# Patient Record
Sex: Male | Born: 1945 | Race: Black or African American | Hispanic: No | Smoking: Former smoker
Health system: Southern US, Community
[De-identification: ages and names within clinical notes are randomized; demographics above are authoritative.]

## PROBLEM LIST (undated history)

## (undated) DIAGNOSIS — M1990 Unspecified osteoarthritis, unspecified site: Secondary | ICD-10-CM

## (undated) DIAGNOSIS — I10 Essential (primary) hypertension: Secondary | ICD-10-CM

## (undated) DIAGNOSIS — K219 Gastro-esophageal reflux disease without esophagitis: Secondary | ICD-10-CM

## (undated) DIAGNOSIS — D563 Thalassemia minor: Secondary | ICD-10-CM

## (undated) DIAGNOSIS — N201 Calculus of ureter: Secondary | ICD-10-CM

## (undated) DIAGNOSIS — N138 Other obstructive and reflux uropathy: Secondary | ICD-10-CM

## (undated) DIAGNOSIS — R768 Other specified abnormal immunological findings in serum: Secondary | ICD-10-CM

## (undated) DIAGNOSIS — N401 Enlarged prostate with lower urinary tract symptoms: Secondary | ICD-10-CM

## (undated) DIAGNOSIS — I428 Other cardiomyopathies: Secondary | ICD-10-CM

## (undated) DIAGNOSIS — J302 Other seasonal allergic rhinitis: Secondary | ICD-10-CM

## (undated) DIAGNOSIS — E785 Hyperlipidemia, unspecified: Secondary | ICD-10-CM

## (undated) DIAGNOSIS — Z87440 Personal history of urinary (tract) infections: Secondary | ICD-10-CM

## (undated) HISTORY — DX: Other cardiomyopathies: I42.8

## (undated) HISTORY — PX: OTHER SURGICAL HISTORY: SHX169

## (undated) HISTORY — DX: Gastro-esophageal reflux disease without esophagitis: K21.9

## (undated) HISTORY — DX: Other seasonal allergic rhinitis: J30.2

## (undated) HISTORY — DX: Hyperlipidemia, unspecified: E78.5

---

## 1996-11-23 HISTORY — PX: ROTATOR CUFF REPAIR: SHX139

## 2003-02-22 ENCOUNTER — Ambulatory Visit (HOSPITAL_COMMUNITY): Admission: RE | Admit: 2003-02-22 | Discharge: 2003-02-22 | Payer: Self-pay | Admitting: Family Medicine

## 2003-02-22 ENCOUNTER — Encounter: Payer: Self-pay | Admitting: Family Medicine

## 2004-05-15 ENCOUNTER — Ambulatory Visit (HOSPITAL_COMMUNITY): Admission: RE | Admit: 2004-05-15 | Discharge: 2004-05-15 | Payer: Self-pay | Admitting: Family Medicine

## 2004-06-30 ENCOUNTER — Ambulatory Visit (HOSPITAL_COMMUNITY): Admission: RE | Admit: 2004-06-30 | Discharge: 2004-06-30 | Payer: Self-pay | Admitting: Internal Medicine

## 2006-03-24 ENCOUNTER — Ambulatory Visit (HOSPITAL_COMMUNITY): Admission: RE | Admit: 2006-03-24 | Discharge: 2006-03-24 | Payer: Self-pay | Admitting: Family Medicine

## 2006-12-30 ENCOUNTER — Ambulatory Visit (HOSPITAL_COMMUNITY): Admission: RE | Admit: 2006-12-30 | Discharge: 2006-12-30 | Payer: Self-pay | Admitting: Family Medicine

## 2010-11-23 HISTORY — PX: NASAL SINUS SURGERY: SHX719

## 2013-06-19 ENCOUNTER — Telehealth: Payer: Self-pay

## 2013-06-19 NOTE — Telephone Encounter (Signed)
Called pt. His last colonoscopy was 06/30/2004. He is not having any problems and no family hx of colon cancer. ( Requested the path from Trish in [facility]) . Looks like he is not due until 06/2014 and we will make sure he is on recall.

## 2013-06-19 NOTE — Telephone Encounter (Signed)
Letter, procedure and path report faxed to Dr. Regino Schultze.

## 2013-06-19 NOTE — Telephone Encounter (Signed)
Reminder in epic °

## 2013-06-19 NOTE — Telephone Encounter (Signed)
Pt called this morning to be triaged. He had received a letter from DS back in June. Pt transferred to DS VM and she will call patient back later. [phone]

## 2013-07-18 ENCOUNTER — Encounter: Payer: Self-pay | Admitting: Internal Medicine

## 2013-07-19 ENCOUNTER — Telehealth: Payer: Self-pay | Admitting: Gastroenterology

## 2013-07-19 ENCOUNTER — Ambulatory Visit: Payer: Self-pay | Admitting: Gastroenterology

## 2013-07-19 NOTE — Telephone Encounter (Signed)
Please offer reschedule OV.

## 2013-07-19 NOTE — Telephone Encounter (Signed)
Pt was a no show

## 2013-07-20 ENCOUNTER — Encounter: Payer: Self-pay | Admitting: Gastroenterology

## 2013-07-20 NOTE — Telephone Encounter (Signed)
Mailed letter for patient to call our office to [facility] his OV

## 2013-08-21 ENCOUNTER — Other Ambulatory Visit: Payer: Self-pay | Admitting: Internal Medicine

## 2013-08-21 ENCOUNTER — Ambulatory Visit (INDEPENDENT_AMBULATORY_CARE_PROVIDER_SITE_OTHER): Payer: Medicare Other | Admitting: Gastroenterology

## 2013-08-21 ENCOUNTER — Encounter: Payer: Self-pay | Admitting: Gastroenterology

## 2013-08-21 VITALS — BP 140/81 | HR 63 | Temp 98.3°F | Ht 70.0 in | Wt 190.0 lb

## 2013-08-21 DIAGNOSIS — K625 Hemorrhage of anus and rectum: Secondary | ICD-10-CM

## 2013-08-21 MED ORDER — PEG 3350-KCL-NA BICARB-NACL 420 G PO SOLR: 4000.0000 mL | ORAL | Status: DC

## 2013-08-21 NOTE — Patient Instructions (Signed)
1. We have scheduled you for a colonoscopy with Dr. Rourk. Please see separate instructions. 

## 2013-08-21 NOTE — Progress Notes (Signed)
Primary Care Physician:  Kirk Ruths, MD  Primary Gastroenterologist:  Roetta Sessions, MD   Chief Complaint  Patient presents with  . Rectal Bleeding    stopped now    HPI:  James Mccall is a 67 y.o. male here for further evaluation of rectal bleeding. This occurred last month. Lasted for about a week. No constipation, diarrhea, abdominal pain, n/v, heartburn, dysphagia. On omeprazole for ?LPR per ENT. Last colonoscopy in 2005. He had internal hemorrhoids, coagulated polyp removed from the rectum which was benign without adenomatous changes. Also had left-sided and transverse diverticula. No family history of colon cancer.  Current Outpatient Prescriptions  Medication Sig Dispense Refill  . fluticasone (FLONASE) 50 MCG/ACT nasal spray Place 1 spray into the nose daily.       Marland Kitchen ipratropium (ATROVENT) 0.06 % nasal spray Place 1 spray into the nose 3 (three) times daily.       . meloxicam (MOBIC) 7.5 MG tablet Take 7.5 mg by mouth daily.       Marland Kitchen omeprazole (PRILOSEC) 20 MG capsule Take 20 mg by mouth daily.       Marland Kitchen RAPAFLO 8 MG CAPS capsule Take 8 mg by mouth daily with breakfast.       . simvastatin (ZOCOR) 20 MG tablet 20 mg daily.       . tamsulosin (FLOMAX) 0.4 MG CAPS capsule Take 0.4 mg by mouth daily.       Marland Kitchen zolpidem (AMBIEN) 5 MG tablet Take 5 mg by mouth at bedtime as needed.        No current facility-administered medications for this visit.    Allergies as of 08/21/2013  . (No Known Allergies)    Past Medical History  Diagnosis Date  . Hyperlipidemia   . GERD (gastroesophageal reflux disease)   . BPH (benign prostatic hyperplasia)   . Seasonal allergies     Past Surgical History  Procedure Laterality Date  . Colonoscopy  06/30/04    [id number] hemorrhoids/pedunculated polyp in the rectum/left-sided and transverse diverticula  . Shoulder sugery      both  . Nasal sinus surgery      Family History  Problem Relation Age of Onset  . Colon cancer Neg Hx      History   Social History  . Marital Status: Married    Spouse Name: N/A    Number of Children: N/A  . Years of Education: N/A   Occupational History  . Not on file.   Social History Main Topics  . Smoking status: Never Smoker   . Smokeless tobacco: Not on file  . Alcohol Use: Yes     Comment: 1-2 weekly  . Drug Use: No  . Sexual Activity: Not on file   Other Topics Concern  . Not on file   Social History Narrative  . No narrative on file      ROS:  General: Negative for anorexia, weight loss, fever, chills, fatigue, weakness. Eyes: Negative for vision changes.  ENT: Negative for hoarseness, difficulty swallowing , nasal congestion. CV: Negative for chest pain, angina, palpitations, dyspnea on exertion, peripheral edema.  Respiratory: Negative for dyspnea at rest, dyspnea on exertion, cough, sputum, wheezing.  GI: See history of present illness. GU:  Negative for dysuria, hematuria, urinary incontinence, urinary frequency, nocturnal urination.  MS: Negative for joint pain, low back pain.  Derm: Negative for rash or itching.  Neuro: Negative for weakness, abnormal sensation, seizure, frequent headaches, memory loss, confusion.  Psych: Negative for  anxiety, depression, suicidal ideation, hallucinations.  Endo: Negative for unusual weight change.  Heme: Negative for bruising or bleeding. Allergy: Negative for rash or hives.    Physical Examination:  BP 140/81  Pulse 63  Temp(Src) 98.3 F (36.8 C) (Oral)  Ht 5\' 10"  (1.778 m)  Wt 190 lb (86.183 kg)  BMI 27.26 kg/m2   General: Well-nourished, well-developed in no acute distress.  Head: Normocephalic, atraumatic.   Eyes: Conjunctiva pink, no icterus. Mouth: Oropharyngeal mucosa moist and pink , no lesions erythema or exudate. Neck: Supple without thyromegaly, masses, or lymphadenopathy.  Lungs: Clear to auscultation bilaterally.  Heart: Regular rate and rhythm, no murmurs rubs or gallops.  Abdomen: Bowel  sounds are normal, nontender, nondistended, no hepatosplenomegaly or masses, no abdominal bruits or    hernia , no rebound or guarding.   Rectal: Deferred Extremities: No lower extremity edema. No clubbing or deformities.  Neuro: Alert and oriented x 4 , grossly normal neurologically.  Skin: Warm and dry, no rash or jaundice.   Psych: Alert and cooperative, normal mood and affect.

## 2013-08-21 NOTE — Assessment & Plan Note (Signed)
67 year old gentleman with recent rectal bleeding which is now resolved. Likely due to benign anorectal source such as hemorrhoids. Last colonoscopy 9 years ago, benign polyp removed from the rectum, internal hemorrhoids, diverticulosis. Recommend diagnostic colonoscopy at this time.  I have discussed the risks, alternatives, benefits with regards to but not limited to the risk of reaction to medication, bleeding, infection, perforation and the patient is agreeable to proceed. Written consent to be obtained.

## 2013-08-21 NOTE — Progress Notes (Signed)
CC'd to PCP 

## 2013-08-28 ENCOUNTER — Ambulatory Visit (HOSPITAL_COMMUNITY)
Admission: RE | Admit: 2013-08-28 | Discharge: 2013-08-28 | Disposition: A | Discharge status: Home / Self Care | Payer: Medicare Other | Source: Ambulatory Visit | Attending: Internal Medicine | Admitting: Internal Medicine

## 2013-08-28 ENCOUNTER — Encounter (HOSPITAL_COMMUNITY): Payer: Self-pay | Admitting: *Deleted

## 2013-08-28 ENCOUNTER — Encounter (HOSPITAL_COMMUNITY)
Admission: RE | Disposition: A | Discharge status: Home / Self Care | Payer: Self-pay | Source: Ambulatory Visit | Attending: Internal Medicine

## 2013-08-28 DIAGNOSIS — K921 Melena: Secondary | ICD-10-CM | POA: Insufficient documentation

## 2013-08-28 DIAGNOSIS — K625 Hemorrhage of anus and rectum: Secondary | ICD-10-CM

## 2013-08-28 DIAGNOSIS — D126 Benign neoplasm of colon, unspecified: Secondary | ICD-10-CM

## 2013-08-28 DIAGNOSIS — K5730 Diverticulosis of large intestine without perforation or abscess without bleeding: Secondary | ICD-10-CM | POA: Insufficient documentation

## 2013-08-28 DIAGNOSIS — K648 Other hemorrhoids: Secondary | ICD-10-CM

## 2013-08-28 HISTORY — PX: COLONOSCOPY: SHX5424

## 2013-08-28 SURGERY — COLONOSCOPY
Anesthesia: Moderate Sedation

## 2013-08-28 MED ORDER — ONDANSETRON HCL 4 MG/2ML IJ SOLN
INTRAMUSCULAR | Status: AC
  Filled 2013-08-28: qty 2

## 2013-08-28 MED ORDER — STERILE WATER FOR IRRIGATION IR SOLN
Status: DC | PRN
  Administered 2013-08-28: 10:00:00

## 2013-08-28 MED ORDER — MEPERIDINE HCL 100 MG/ML IJ SOLN
INTRAMUSCULAR | Status: AC
  Filled 2013-08-28: qty 2

## 2013-08-28 MED ORDER — SODIUM CHLORIDE 0.9 % IV SOLN
INTRAVENOUS | Status: DC
  Administered 2013-08-28: 09:00:00 via INTRAVENOUS

## 2013-08-28 MED ORDER — ONDANSETRON HCL 4 MG/2ML IJ SOLN
INTRAMUSCULAR | Status: DC | PRN
  Administered 2013-08-28: 4 mg via INTRAVENOUS

## 2013-08-28 MED ORDER — MIDAZOLAM HCL 5 MG/5ML IJ SOLN
INTRAMUSCULAR | Status: DC | PRN
  Administered 2013-08-28: 1 mg via INTRAVENOUS
  Administered 2013-08-28: 2 mg via INTRAVENOUS

## 2013-08-28 MED ORDER — MIDAZOLAM HCL 5 MG/5ML IJ SOLN
INTRAMUSCULAR | Status: AC
  Filled 2013-08-28: qty 10

## 2013-08-28 MED ORDER — MEPERIDINE HCL 100 MG/ML IJ SOLN
INTRAMUSCULAR | Status: DC | PRN
  Administered 2013-08-28: 50 mg via INTRAVENOUS

## 2013-08-28 NOTE — Op Note (Signed)
[facility] [address] [place], [address]   COLONOSCOPY PROCEDURE REPORT  PATIENT: James Mccall, James Mccall  MR#:         [id number] BIRTHDATE: January 06, 1946 , 67  yrs. old GENDER: Male ENDOSCOPIST: R.  Roetta Sessions, MD FACP FACG REFERRED BY:  Karleen Hampshire, M.D. PROCEDURE DATE:  08/28/2013 PROCEDURE:     Colonoscopy with snare polypectomy  INDICATIONS: hematochezia  INFORMED CONSENT:  The risks, benefits, alternatives and imponderables including but not limited to bleeding, perforation as well as the possibility of a missed lesion have been reviewed.  The potential for biopsy, lesion removal, etc. have also been discussed.  Questions have been answered.  All parties agreeable. Please see the history and physical in the medical record for more information.  MEDICATIONS: Versed 3 mg IV and Demerol 50 mg IV in divided doses. Zofran 4 mg IV  DESCRIPTION OF PROCEDURE:  After a digital rectal exam was performed, the EC-[id number] ([id number])  colonoscope was advanced from the anus through the rectum and colon to the area of the cecum, ileocecal valve and appendiceal orifice.  The cecum was deeply intubated.  These structures were well-seen and photographed for the record.  From the level of the cecum and ileocecal valve, the scope was slowly and cautiously withdrawn.  The mucosal surfaces were carefully surveyed utilizing scope tip deflection to facilitate fold flattening as needed.  The scope was pulled down into the rectum where a thorough examination including retroflexion was performed.    FINDINGS:  Adequate preparation. Internal hemorrhoids. Otherwise, normal rectum. Scattered left-sided and transverse diverticula; (1) 5 mm polyp in the mid sigmoid segment; otherwise, the remainder of the colonic mucosa appeared normal.  THERAPEUTIC / DIAGNOSTIC MANEUVERS PERFORMED:  The above-mentioned polyp was cold snared/removed  COMPLICATIONS: none  CECAL WITHDRAWAL  TIME:  13 minutes  IMPRESSION:  pancolonic diverticulosis; colonic polyp-removed as described above. Internal hemorrhoids-likely source of hematochezia  RECOMMENDATIONS: get followup on pathology. Begin Benefiber 2 teaspoons twice daily. If rectal bleeding is recurrent, would consider hemorrhoid banding.   _______________________________ eSigned:  R. Roetta Sessions, MD FACP [facility] 08/28/2013 10:54 AM   CC:

## 2013-08-28 NOTE — H&P (View-Only) (Signed)
Primary Care Physician:  Kirk Ruths, MD  Primary Gastroenterologist:  Roetta Sessions, MD   Chief Complaint  Patient presents with  . Rectal Bleeding    stopped now    HPI:  James Mccall is a 67 y.o. male here for further evaluation of rectal bleeding. This occurred last month. Lasted for about a week. No constipation, diarrhea, abdominal pain, n/v, heartburn, dysphagia. On omeprazole for ?LPR per ENT. Last colonoscopy in 2005. He had internal hemorrhoids, coagulated polyp removed from the rectum which was benign without adenomatous changes. Also had left-sided and transverse diverticula. No family history of colon cancer.  Current Outpatient Prescriptions  Medication Sig Dispense Refill  . fluticasone (FLONASE) 50 MCG/ACT nasal spray Place 1 spray into the nose daily.       Marland Kitchen ipratropium (ATROVENT) 0.06 % nasal spray Place 1 spray into the nose 3 (three) times daily.       . meloxicam (MOBIC) 7.5 MG tablet Take 7.5 mg by mouth daily.       Marland Kitchen omeprazole (PRILOSEC) 20 MG capsule Take 20 mg by mouth daily.       Marland Kitchen RAPAFLO 8 MG CAPS capsule Take 8 mg by mouth daily with breakfast.       . simvastatin (ZOCOR) 20 MG tablet 20 mg daily.       . tamsulosin (FLOMAX) 0.4 MG CAPS capsule Take 0.4 mg by mouth daily.       Marland Kitchen zolpidem (AMBIEN) 5 MG tablet Take 5 mg by mouth at bedtime as needed.        No current facility-administered medications for this visit.    Allergies as of 08/21/2013  . (No Known Allergies)    Past Medical History  Diagnosis Date  . Hyperlipidemia   . GERD (gastroesophageal reflux disease)   . BPH (benign prostatic hyperplasia)   . Seasonal allergies     Past Surgical History  Procedure Laterality Date  . Colonoscopy  06/30/04    [id number] hemorrhoids/pedunculated polyp in the rectum/left-sided and transverse diverticula  . Shoulder sugery      both  . Nasal sinus surgery      Family History  Problem Relation Age of Onset  . Colon cancer Neg Hx      History   Social History  . Marital Status: Married    Spouse Name: N/A    Number of Children: N/A  . Years of Education: N/A   Occupational History  . Not on file.   Social History Main Topics  . Smoking status: Never Smoker   . Smokeless tobacco: Not on file  . Alcohol Use: Yes     Comment: 1-2 weekly  . Drug Use: No  . Sexual Activity: Not on file   Other Topics Concern  . Not on file   Social History Narrative  . No narrative on file      ROS:  General: Negative for anorexia, weight loss, fever, chills, fatigue, weakness. Eyes: Negative for vision changes.  ENT: Negative for hoarseness, difficulty swallowing , nasal congestion. CV: Negative for chest pain, angina, palpitations, dyspnea on exertion, peripheral edema.  Respiratory: Negative for dyspnea at rest, dyspnea on exertion, cough, sputum, wheezing.  GI: See history of present illness. GU:  Negative for dysuria, hematuria, urinary incontinence, urinary frequency, nocturnal urination.  MS: Negative for joint pain, low back pain.  Derm: Negative for rash or itching.  Neuro: Negative for weakness, abnormal sensation, seizure, frequent headaches, memory loss, confusion.  Psych: Negative for  anxiety, depression, suicidal ideation, hallucinations.  Endo: Negative for unusual weight change.  Heme: Negative for bruising or bleeding. Allergy: Negative for rash or hives.    Physical Examination:  BP 140/81  Pulse 63  Temp(Src) 98.3 F (36.8 C) (Oral)  Ht 5\' 10"  (1.778 m)  Wt 190 lb (86.183 kg)  BMI 27.26 kg/m2   General: Well-nourished, well-developed in no acute distress.  Head: Normocephalic, atraumatic.   Eyes: Conjunctiva pink, no icterus. Mouth: Oropharyngeal mucosa moist and pink , no lesions erythema or exudate. Neck: Supple without thyromegaly, masses, or lymphadenopathy.  Lungs: Clear to auscultation bilaterally.  Heart: Regular rate and rhythm, no murmurs rubs or gallops.  Abdomen: Bowel  sounds are normal, nontender, nondistended, no hepatosplenomegaly or masses, no abdominal bruits or    hernia , no rebound or guarding.   Rectal: Deferred Extremities: No lower extremity edema. No clubbing or deformities.  Neuro: Alert and oriented x 4 , grossly normal neurologically.  Skin: Warm and dry, no rash or jaundice.   Psych: Alert and cooperative, normal mood and affect.

## 2013-08-28 NOTE — Interval H&P Note (Signed)
History and Physical Interval Note:  08/28/2013 10:14 AM  James Mccall  has presented today for surgery, with the diagnosis of RECTAL BLEEDING  The various methods of treatment have been discussed with the patient and family. After consideration of risks, benefits and other options for treatment, the patient has consented to  Procedure(s) with comments: COLONOSCOPY (N/A) - 9:45 as a surgical intervention .  The patient's history has been reviewed, patient examined, no change in status, stable for surgery.  I have reviewed the patient's chart and labs.  Questions were answered to the patient's satisfaction.     No change. Colonoscopy per plan. The risks, benefits, limitations, alternatives and imponderables have been reviewed with the patient. Questions have been answered. All parties are agreeable.

## 2013-08-30 ENCOUNTER — Encounter (HOSPITAL_COMMUNITY): Payer: Self-pay | Admitting: Internal Medicine

## 2013-08-31 ENCOUNTER — Encounter: Payer: Self-pay | Admitting: Internal Medicine

## 2013-09-05 ENCOUNTER — Encounter: Payer: Self-pay | Admitting: Internal Medicine

## 2014-12-06 DIAGNOSIS — E663 Overweight: Secondary | ICD-10-CM | POA: Diagnosis not present

## 2014-12-06 DIAGNOSIS — Z6829 Body mass index (BMI) 29.0-29.9, adult: Secondary | ICD-10-CM | POA: Diagnosis not present

## 2014-12-06 DIAGNOSIS — J180 Bronchopneumonia, unspecified organism: Secondary | ICD-10-CM | POA: Diagnosis not present

## 2015-02-04 DIAGNOSIS — Z6829 Body mass index (BMI) 29.0-29.9, adult: Secondary | ICD-10-CM | POA: Diagnosis not present

## 2015-02-04 DIAGNOSIS — I10 Essential (primary) hypertension: Secondary | ICD-10-CM | POA: Diagnosis not present

## 2015-03-21 DIAGNOSIS — J209 Acute bronchitis, unspecified: Secondary | ICD-10-CM | POA: Diagnosis not present

## 2015-05-21 ENCOUNTER — Other Ambulatory Visit (HOSPITAL_COMMUNITY): Payer: Self-pay | Admitting: Family Medicine

## 2015-05-21 ENCOUNTER — Ambulatory Visit (HOSPITAL_COMMUNITY)
Admission: RE | Admit: 2015-05-21 | Discharge: 2015-05-21 | Disposition: A | Discharge status: Home / Self Care | Payer: Medicare Other | Source: Ambulatory Visit | Attending: Family Medicine | Admitting: Family Medicine

## 2015-05-21 DIAGNOSIS — Z1389 Encounter for screening for other disorder: Secondary | ICD-10-CM | POA: Diagnosis not present

## 2015-05-21 DIAGNOSIS — N400 Enlarged prostate without lower urinary tract symptoms: Secondary | ICD-10-CM | POA: Diagnosis not present

## 2015-05-21 DIAGNOSIS — M542 Cervicalgia: Secondary | ICD-10-CM | POA: Insufficient documentation

## 2015-05-21 DIAGNOSIS — M25512 Pain in left shoulder: Secondary | ICD-10-CM | POA: Insufficient documentation

## 2015-05-21 DIAGNOSIS — Z0000 Encounter for general adult medical examination without abnormal findings: Secondary | ICD-10-CM | POA: Diagnosis not present

## 2015-05-21 DIAGNOSIS — Z23 Encounter for immunization: Secondary | ICD-10-CM | POA: Diagnosis not present

## 2015-05-21 DIAGNOSIS — M5412 Radiculopathy, cervical region: Secondary | ICD-10-CM

## 2015-05-21 DIAGNOSIS — M47812 Spondylosis without myelopathy or radiculopathy, cervical region: Secondary | ICD-10-CM | POA: Diagnosis not present

## 2015-05-21 DIAGNOSIS — M5032 Other cervical disc degeneration, mid-cervical region: Secondary | ICD-10-CM | POA: Diagnosis not present

## 2015-05-21 DIAGNOSIS — M479 Spondylosis, unspecified: Secondary | ICD-10-CM | POA: Diagnosis not present

## 2015-05-21 DIAGNOSIS — F17200 Nicotine dependence, unspecified, uncomplicated: Secondary | ICD-10-CM

## 2015-05-22 DIAGNOSIS — D582 Other hemoglobinopathies: Secondary | ICD-10-CM | POA: Diagnosis not present

## 2015-05-24 ENCOUNTER — Other Ambulatory Visit (HOSPITAL_COMMUNITY): Payer: Self-pay | Admitting: Family Medicine

## 2015-05-24 DIAGNOSIS — M5090 Cervical disc disorder, unspecified, unspecified cervical region: Secondary | ICD-10-CM

## 2015-05-29 DIAGNOSIS — Z1211 Encounter for screening for malignant neoplasm of colon: Secondary | ICD-10-CM | POA: Diagnosis not present

## 2015-05-30 ENCOUNTER — Ambulatory Visit (HOSPITAL_COMMUNITY)
Admission: RE | Admit: 2015-05-30 | Discharge: 2015-05-30 | Disposition: A | Discharge status: Home / Self Care | Payer: Medicare Other | Source: Ambulatory Visit | Attending: Family Medicine | Admitting: Family Medicine

## 2015-05-30 DIAGNOSIS — Z1389 Encounter for screening for other disorder: Secondary | ICD-10-CM | POA: Diagnosis not present

## 2015-05-30 DIAGNOSIS — M479 Spondylosis, unspecified: Secondary | ICD-10-CM | POA: Diagnosis not present

## 2015-05-30 DIAGNOSIS — M5090 Cervical disc disorder, unspecified, unspecified cervical region: Secondary | ICD-10-CM

## 2015-05-30 DIAGNOSIS — F17200 Nicotine dependence, unspecified, uncomplicated: Secondary | ICD-10-CM

## 2015-05-30 DIAGNOSIS — M542 Cervicalgia: Secondary | ICD-10-CM | POA: Insufficient documentation

## 2015-05-30 DIAGNOSIS — M5021 Other cervical disc displacement,  high cervical region: Secondary | ICD-10-CM | POA: Diagnosis not present

## 2015-05-30 DIAGNOSIS — M5032 Other cervical disc degeneration, mid-cervical region: Secondary | ICD-10-CM | POA: Insufficient documentation

## 2015-05-30 DIAGNOSIS — I77811 Abdominal aortic ectasia: Secondary | ICD-10-CM | POA: Diagnosis not present

## 2015-05-30 DIAGNOSIS — J320 Chronic maxillary sinusitis: Secondary | ICD-10-CM | POA: Diagnosis not present

## 2015-05-30 DIAGNOSIS — M5022 Other cervical disc displacement, mid-cervical region: Secondary | ICD-10-CM | POA: Diagnosis not present

## 2015-05-30 DIAGNOSIS — M47812 Spondylosis without myelopathy or radiculopathy, cervical region: Secondary | ICD-10-CM | POA: Diagnosis not present

## 2015-05-30 DIAGNOSIS — Z0000 Encounter for general adult medical examination without abnormal findings: Secondary | ICD-10-CM

## 2015-06-07 ENCOUNTER — Other Ambulatory Visit (HOSPITAL_COMMUNITY): Payer: Medicare Other

## 2015-06-07 DIAGNOSIS — M542 Cervicalgia: Secondary | ICD-10-CM | POA: Diagnosis not present

## 2015-06-07 DIAGNOSIS — M5021 Other cervical disc displacement,  high cervical region: Secondary | ICD-10-CM | POA: Diagnosis not present

## 2015-06-11 DIAGNOSIS — R710 Precipitous drop in hematocrit: Secondary | ICD-10-CM | POA: Diagnosis not present

## 2015-06-17 DIAGNOSIS — N401 Enlarged prostate with lower urinary tract symptoms: Secondary | ICD-10-CM | POA: Diagnosis not present

## 2015-06-17 DIAGNOSIS — N138 Other obstructive and reflux uropathy: Secondary | ICD-10-CM | POA: Diagnosis not present

## 2015-06-25 DIAGNOSIS — R718 Other abnormality of red blood cells: Secondary | ICD-10-CM | POA: Diagnosis not present

## 2015-06-25 DIAGNOSIS — R5383 Other fatigue: Secondary | ICD-10-CM | POA: Insufficient documentation

## 2015-06-25 DIAGNOSIS — Z87438 Personal history of other diseases of male genital organs: Secondary | ICD-10-CM | POA: Insufficient documentation

## 2015-07-08 DIAGNOSIS — H25811 Combined forms of age-related cataract, right eye: Secondary | ICD-10-CM | POA: Diagnosis not present

## 2015-07-08 DIAGNOSIS — H25813 Combined forms of age-related cataract, bilateral: Secondary | ICD-10-CM | POA: Diagnosis not present

## 2015-07-08 DIAGNOSIS — H5203 Hypermetropia, bilateral: Secondary | ICD-10-CM | POA: Diagnosis not present

## 2015-07-09 DIAGNOSIS — R768 Other specified abnormal immunological findings in serum: Secondary | ICD-10-CM | POA: Diagnosis not present

## 2015-07-09 DIAGNOSIS — D563 Thalassemia minor: Secondary | ICD-10-CM | POA: Insufficient documentation

## 2015-07-12 DIAGNOSIS — J209 Acute bronchitis, unspecified: Secondary | ICD-10-CM | POA: Diagnosis not present

## 2015-07-12 DIAGNOSIS — Z1389 Encounter for screening for other disorder: Secondary | ICD-10-CM | POA: Diagnosis not present

## 2015-08-12 ENCOUNTER — Other Ambulatory Visit (HOSPITAL_COMMUNITY): Payer: Medicare Other

## 2015-08-12 NOTE — Patient Instructions (Signed)
THORSTEN CLIMER  08/12/2015     @PREFPERIOPPHARMACY @   Your procedure is scheduled on 08/15/2015.  Report to [facility] at 12:00 A.M.  Call this number if you have problems the morning of surgery:  [phone]   Remember:  Do not eat food or drink liquids after midnight.  Take these medicines the morning of surgery with A SIP OF WATER Flonase, Mobic, Prilosec, Flomax   Do not wear jewelry, make-up or nail polish.  Do not wear lotions, powders, or perfumes.  You may wear deodorant.  Do not shave 48 hours prior to surgery.  Men may shave face and neck.  Do not bring valuables to the hospital.  [facility] is not responsible for any belongings or valuables.  Contacts, dentures or bridgework may not be worn into surgery.  Leave your suitcase in the car.  After surgery it may be brought to your room.  For patients admitted to the hospital, discharge time will be determined by your treatment team.  Patients discharged the day of surgery will not be allowed to drive home.   Please read over the following fact sheets that you were given. Anesthesia Post-op Instructions     PATIENT INSTRUCTIONS POST-ANESTHESIA  IMMEDIATELY FOLLOWING SURGERY:  Do not drive or operate machinery for the first twenty four hours after surgery.  Do not make any important decisions for twenty four hours after surgery or while taking narcotic pain medications or sedatives.  If you develop intractable nausea and vomiting or a severe headache please notify your doctor immediately.  FOLLOW-UP:  Please make an appointment with your surgeon as instructed. You do not need to follow up with anesthesia unless specifically instructed to do so.  WOUND CARE INSTRUCTIONS (if applicable):  Keep a dry clean dressing on the anesthesia/puncture wound site if there is drainage.  Once the wound has quit draining you may leave it open to air.  Generally you should leave the bandage intact for twenty four hours unless there is  drainage.  If the epidural site drains for more than 36-48 hours please call the anesthesia department.  QUESTIONS?:  Please feel free to call your physician or the hospital operator if you have any questions, and they will be happy to assist you.      Cataract Surgery  A cataract is a clouding of the lens of the eye. When a lens becomes cloudy, vision is reduced based on the degree and nature of the clouding. Surgery may be needed to improve vision. Surgery removes the cloudy lens and usually replaces it with a substitute lens (intraocular lens, IOL). LET YOUR EYE DOCTOR KNOW ABOUT:  Allergies to food or medicine.  Medicines taken including herbs, eye drops, over-the-counter medicines, and creams.  Use of steroids (by mouth or creams).  Previous problems with anesthetics or numbing medicine.  History of bleeding problems or blood clots.  Previous surgery.  Other health problems, including diabetes and kidney problems.  Possibility of pregnancy, if this applies. RISKS AND COMPLICATIONS  Infection.  Inflammation of the eyeball (endophthalmitis) that can spread to both eyes (sympathetic ophthalmia).  Poor wound healing.  If an IOL is inserted, it can later fall out of proper position. This is very uncommon.  Clouding of the part of your eye that holds an IOL in place. This is called an "after-cataract." These are uncommon but easily treated. BEFORE THE PROCEDURE  Do not eat or drink anything except small amounts of water for 8 to 12  before your surgery, or as directed by your caregiver.  Unless you are told otherwise, continue any eye drops you have been prescribed.  Talk to your primary caregiver about all other medicines that you take (both prescription and nonprescription). In some cases, you may need to stop or change medicines near the time of your surgery. This is most important if you are taking blood-thinning medicine.Do not stop medicines unless you are told to do  so.  Arrange for someone to drive you to and from the procedure.  Do not put contact lenses in either eye on the day of your surgery. PROCEDURE There is more than one method for safely removing a cataract. Your doctor can explain the differences and help determine which is best for you. Phacoemulsification surgery is the most common form of cataract surgery.  An injection is given behind the eye or eye drops are given to make this a painless procedure.  A small cut (incision) is made on the edge of the clear, dome-shaped surface that covers the front of the eye (cornea).  A tiny probe is painlessly inserted into the eye. This device gives off ultrasound waves that soften and break up the cloudy center of the lens. This makes it easier for the cloudy lens to be removed by suction.  An IOL may be implanted.  The normal lens of the eye is covered by a clear capsule. Part of that capsule is intentionally left in the eye to support the IOL.  Your surgeon may or may not use stitches to close the incision. There are other forms of cataract surgery that require a larger incision and stitches to close the eye. This approach is taken in cases where the doctor feels that the cataract cannot be easily removed using phacoemulsification. AFTER THE PROCEDURE  When an IOL is implanted, it does not need care. It becomes a permanent part of your eye and cannot be seen or felt.  Your doctor will schedule follow-up exams to check on your progress.  Review your other medicines with your doctor to see which can be resumed after surgery.  Use eye drops or take medicine as prescribed by your doctor. Document Released: 10/29/2011 Document Revised: 03/26/2014 Document Reviewed: 10/29/2011 [employer] Patient Information 2015 [place], [place]. This information is not intended to replace advice given to you by your health care provider. Make sure you discuss any questions you have with your health care provider.

## 2015-08-13 ENCOUNTER — Other Ambulatory Visit: Payer: Self-pay

## 2015-08-13 ENCOUNTER — Encounter (HOSPITAL_COMMUNITY): Payer: Self-pay

## 2015-08-13 ENCOUNTER — Encounter (HOSPITAL_COMMUNITY)
Admission: RE | Admit: 2015-08-13 | Discharge: 2015-08-13 | Disposition: A | Discharge status: Home / Self Care | Payer: Medicare Other | Source: Ambulatory Visit | Attending: Ophthalmology | Admitting: Ophthalmology

## 2015-08-13 DIAGNOSIS — Z79899 Other long term (current) drug therapy: Secondary | ICD-10-CM | POA: Diagnosis not present

## 2015-08-13 DIAGNOSIS — Z01810 Encounter for preprocedural cardiovascular examination: Secondary | ICD-10-CM | POA: Diagnosis not present

## 2015-08-13 DIAGNOSIS — Z87891 Personal history of nicotine dependence: Secondary | ICD-10-CM | POA: Diagnosis not present

## 2015-08-13 DIAGNOSIS — K219 Gastro-esophageal reflux disease without esophagitis: Secondary | ICD-10-CM | POA: Diagnosis not present

## 2015-08-13 DIAGNOSIS — H269 Unspecified cataract: Secondary | ICD-10-CM | POA: Diagnosis not present

## 2015-08-13 DIAGNOSIS — M1990 Unspecified osteoarthritis, unspecified site: Secondary | ICD-10-CM | POA: Diagnosis not present

## 2015-08-13 DIAGNOSIS — Z01812 Encounter for preprocedural laboratory examination: Secondary | ICD-10-CM | POA: Diagnosis not present

## 2015-08-13 HISTORY — DX: Unspecified osteoarthritis, unspecified site: M19.90

## 2015-08-13 LAB — BASIC METABOLIC PANEL
ANION GAP: 5 (ref 5–15)
BUN: 20 mg/dL (ref 6–20)
CO2: 25 mmol/L (ref 22–32)
Calcium: 8.8 mg/dL — ABNORMAL LOW (ref 8.9–10.3)
Chloride: 108 mmol/L (ref 101–111)
Creatinine, Ser: 0.94 mg/dL (ref 0.61–1.24)
Glucose, Bld: 108 mg/dL — ABNORMAL HIGH (ref 65–99)
Potassium: 4.1 mmol/L (ref 3.5–5.1)
Sodium: 138 mmol/L (ref 135–145)

## 2015-08-13 LAB — CBC WITH DIFFERENTIAL/PLATELET
BASOS ABS: 0 10*3/uL (ref 0.0–0.1)
Basophils Relative: 0 %
Eosinophils Absolute: 0.3 10*3/uL (ref 0.0–0.7)
Eosinophils Relative: 4 %
HEMATOCRIT: 36.9 % — AB (ref 39.0–52.0)
Hemoglobin: 13.2 g/dL (ref 13.0–17.0)
LYMPHS PCT: 36 %
Lymphs Abs: 2.2 10*3/uL (ref 0.7–4.0)
MCH: 27.8 pg (ref 26.0–34.0)
MCHC: 35.8 g/dL (ref 30.0–36.0)
MCV: 77.8 fL — AB (ref 78.0–100.0)
Monocytes Absolute: 0.6 10*3/uL (ref 0.1–1.0)
Monocytes Relative: 9 %
NEUTROS ABS: 3.1 10*3/uL (ref 1.7–7.7)
Neutrophils Relative %: 51 %
PLATELETS: 202 10*3/uL (ref 150–400)
RBC: 4.74 MIL/uL (ref 4.22–5.81)
RDW: 16 % — ABNORMAL HIGH (ref 11.5–15.5)
WBC: 6.1 10*3/uL (ref 4.0–10.5)

## 2015-08-13 NOTE — Pre-Procedure Instructions (Signed)
Patient given information to sign up for my chart at home. 

## 2015-08-14 MED ORDER — CYCLOPENTOLATE-PHENYLEPHRINE OP SOLN OPTIME - NO CHARGE
OPHTHALMIC | Status: AC
  Filled 2015-08-14: qty 2

## 2015-08-14 MED ORDER — PHENYLEPHRINE HCL 2.5 % OP SOLN
OPHTHALMIC | Status: AC
  Filled 2015-08-14: qty 15

## 2015-08-14 MED ORDER — LIDOCAINE HCL (PF) 1 % IJ SOLN
INTRAMUSCULAR | Status: AC
  Filled 2015-08-14: qty 2

## 2015-08-14 MED ORDER — NEOMYCIN-POLYMYXIN-DEXAMETH 3.5-10000-0.1 OP SUSP
OPHTHALMIC | Status: AC
  Filled 2015-08-14: qty 5

## 2015-08-14 MED ORDER — LIDOCAINE HCL 3.5 % OP GEL
OPHTHALMIC | Status: AC
  Filled 2015-08-14: qty 1

## 2015-08-14 MED ORDER — TETRACAINE HCL 0.5 % OP SOLN
OPHTHALMIC | Status: AC
  Filled 2015-08-14: qty 2

## 2015-08-15 ENCOUNTER — Ambulatory Visit (HOSPITAL_COMMUNITY): Payer: Medicare Other | Admitting: Anesthesiology

## 2015-08-15 ENCOUNTER — Encounter (HOSPITAL_COMMUNITY)
Admission: RE | Disposition: A | Discharge status: Home / Self Care | Payer: Self-pay | Source: Ambulatory Visit | Attending: Ophthalmology

## 2015-08-15 ENCOUNTER — Ambulatory Visit (HOSPITAL_COMMUNITY)
Admission: RE | Admit: 2015-08-15 | Discharge: 2015-08-15 | Disposition: A | Discharge status: Home / Self Care | Payer: Medicare Other | Source: Ambulatory Visit | Attending: Ophthalmology | Admitting: Ophthalmology

## 2015-08-15 ENCOUNTER — Encounter (HOSPITAL_COMMUNITY): Payer: Self-pay | Admitting: *Deleted

## 2015-08-15 DIAGNOSIS — Z79899 Other long term (current) drug therapy: Secondary | ICD-10-CM | POA: Insufficient documentation

## 2015-08-15 DIAGNOSIS — H269 Unspecified cataract: Secondary | ICD-10-CM | POA: Insufficient documentation

## 2015-08-15 DIAGNOSIS — M1990 Unspecified osteoarthritis, unspecified site: Secondary | ICD-10-CM | POA: Insufficient documentation

## 2015-08-15 DIAGNOSIS — Z87891 Personal history of nicotine dependence: Secondary | ICD-10-CM | POA: Diagnosis not present

## 2015-08-15 DIAGNOSIS — Z01810 Encounter for preprocedural cardiovascular examination: Secondary | ICD-10-CM | POA: Diagnosis not present

## 2015-08-15 DIAGNOSIS — Z01812 Encounter for preprocedural laboratory examination: Secondary | ICD-10-CM | POA: Diagnosis not present

## 2015-08-15 DIAGNOSIS — H25811 Combined forms of age-related cataract, right eye: Secondary | ICD-10-CM | POA: Diagnosis not present

## 2015-08-15 DIAGNOSIS — K219 Gastro-esophageal reflux disease without esophagitis: Secondary | ICD-10-CM | POA: Insufficient documentation

## 2015-08-15 HISTORY — PX: CATARACT EXTRACTION W/PHACO: SHX586

## 2015-08-15 SURGERY — PHACOEMULSIFICATION, CATARACT, WITH IOL INSERTION
Anesthesia: Monitor Anesthesia Care | Site: Eye | Laterality: Right

## 2015-08-15 MED ORDER — MIDAZOLAM HCL 2 MG/2ML IJ SOLN
INTRAMUSCULAR | Status: AC
  Filled 2015-08-15: qty 2

## 2015-08-15 MED ORDER — EPINEPHRINE HCL 1 MG/ML IJ SOLN
INTRAMUSCULAR | Status: AC
  Filled 2015-08-15: qty 1

## 2015-08-15 MED ORDER — POVIDONE-IODINE 5 % OP SOLN
OPHTHALMIC | Status: DC | PRN
  Administered 2015-08-15: 1 via OPHTHALMIC

## 2015-08-15 MED ORDER — MIDAZOLAM HCL 2 MG/2ML IJ SOLN
1.0000 mg | INTRAMUSCULAR | Status: DC | PRN
  Administered 2015-08-15: 2 mg via INTRAVENOUS

## 2015-08-15 MED ORDER — FENTANYL CITRATE (PF) 100 MCG/2ML IJ SOLN
25.0000 ug | INTRAMUSCULAR | Status: AC
  Administered 2015-08-15: 25 ug via INTRAVENOUS

## 2015-08-15 MED ORDER — PHENYLEPHRINE HCL 2.5 % OP SOLN
1.0000 [drp] | OPHTHALMIC | Status: AC
  Administered 2015-08-15 (×3): 1 [drp] via OPHTHALMIC

## 2015-08-15 MED ORDER — EPINEPHRINE HCL 1 MG/ML IJ SOLN
INTRAOCULAR | Status: DC | PRN
  Administered 2015-08-15: 500 mL

## 2015-08-15 MED ORDER — LIDOCAINE HCL (PF) 1 % IJ SOLN
INTRAOCULAR | Status: DC | PRN
  Administered 2015-08-15: .6 mL via OPHTHALMIC

## 2015-08-15 MED ORDER — BSS IO SOLN
INTRAOCULAR | Status: DC | PRN
  Administered 2015-08-15: 15 mL

## 2015-08-15 MED ORDER — CYCLOPENTOLATE-PHENYLEPHRINE 0.2-1 % OP SOLN
1.0000 [drp] | Freq: Once | OPHTHALMIC | Status: AC
  Administered 2015-08-15 (×3): 1 [drp] via OPHTHALMIC

## 2015-08-15 MED ORDER — PHENYLEPHRINE-KETOROLAC 1-0.3 % IO SOLN
INTRAOCULAR | Status: AC
  Filled 2015-08-15: qty 4

## 2015-08-15 MED ORDER — LACTATED RINGERS IV SOLN
INTRAVENOUS | Status: DC
  Administered 2015-08-15: 13:00:00 via INTRAVENOUS

## 2015-08-15 MED ORDER — LIDOCAINE HCL 3.5 % OP GEL
1.0000 "application " | Freq: Once | OPHTHALMIC | Status: AC
  Administered 2015-08-15: 1 via OPHTHALMIC

## 2015-08-15 MED ORDER — TETRACAINE HCL 0.5 % OP SOLN
1.0000 [drp] | OPHTHALMIC | Status: AC
  Administered 2015-08-15 (×3): 1 [drp] via OPHTHALMIC

## 2015-08-15 MED ORDER — NEOMYCIN-POLYMYXIN-DEXAMETH 3.5-10000-0.1 OP SUSP
OPHTHALMIC | Status: DC | PRN
  Administered 2015-08-15: 1 [drp] via OPHTHALMIC

## 2015-08-15 MED ORDER — FENTANYL CITRATE (PF) 100 MCG/2ML IJ SOLN
INTRAMUSCULAR | Status: AC
  Filled 2015-08-15: qty 2

## 2015-08-15 MED ORDER — LIDOCAINE 3.5 % OP GEL OPTIME - NO CHARGE
OPHTHALMIC | Status: DC | PRN
  Administered 2015-08-15: 1 [drp] via OPHTHALMIC

## 2015-08-15 MED ORDER — PROVISC 10 MG/ML IO SOLN
INTRAOCULAR | Status: DC | PRN
  Administered 2015-08-15: 0.85 mL via INTRAOCULAR

## 2015-08-15 SURGICAL SUPPLY — 11 items
CLOTH BEACON ORANGE TIMEOUT ST (SAFETY) IMPLANT
EYE SHIELD UNIVERSAL CLEAR (GAUZE/BANDAGES/DRESSINGS) ×3 IMPLANT
GLOVE BIOGEL PI IND STRL 7.0 (GLOVE) IMPLANT
GLOVE BIOGEL PI INDICATOR 7.0 (GLOVE)
GLOVE EXAM NITRILE MD LF STRL (GLOVE) IMPLANT
PAD ARMBOARD 7.5X6 YLW CONV (MISCELLANEOUS) IMPLANT
SIGHTPATH CAT PROC W REG LENS (Ophthalmic Related) ×3 IMPLANT
SYRINGE LUER LOK 1CC (MISCELLANEOUS) IMPLANT
TAPE SURG TRANSPORE 1 IN (GAUZE/BANDAGES/DRESSINGS) ×1 IMPLANT
TAPE SURGICAL TRANSPORE 1 IN (GAUZE/BANDAGES/DRESSINGS) ×2
WATER STERILE IRR 250ML POUR (IV SOLUTION) IMPLANT

## 2015-08-15 NOTE — H&P (Signed)
I have reviewed the H&P, the patient was re-examined, and I have identified no interval changes in medical condition and plan of care since the history and physical of record  

## 2015-08-15 NOTE — Anesthesia Postprocedure Evaluation (Signed)
  Anesthesia Post-op Note  Patient: James Mccall  Procedure(s) Performed: Procedure(s) with comments: CATARACT EXTRACTION PHACO AND INTRAOCULAR LENS PLACEMENT (IOC) (Right) - CDE:5.75  Patient Location: Short Stay  Anesthesia Type:MAC  Level of Consciousness: awake, alert , oriented and patient cooperative  Airway and Oxygen Therapy: Patient Spontanous Breathing  Post-op Pain: none  Post-op Assessment: Post-op Vital signs reviewed, Patient's Cardiovascular Status Stable, Respiratory Function Stable, Patent Airway and Pain level controlled              Post-op Vital Signs: Reviewed and stable  Last Vitals:  Filed Vitals:   08/15/15 1315  BP: 111/70  Temp:   Resp: 23    Complications: No apparent anesthesia complications

## 2015-08-15 NOTE — Transfer of Care (Signed)
Immediate Anesthesia Transfer of Care Note  Patient: James Mccall  Procedure(s) Performed: Procedure(s) with comments: CATARACT EXTRACTION PHACO AND INTRAOCULAR LENS PLACEMENT (IOC) (Right) - CDE:5.75  Patient Location: Short Stay  Anesthesia Type:MAC  Level of Consciousness: awake, alert , oriented and patient cooperative  Airway & Oxygen Therapy: Patient Spontanous Breathing  Post-op Assessment: Report given to RN, Post -op Vital signs reviewed and stable and Patient moving all extremities  Post vital signs: Reviewed and stable  Last Vitals:  Filed Vitals:   08/15/15 1315  BP: 111/70  Temp:   Resp: 23    Complications: No apparent anesthesia complications

## 2015-08-15 NOTE — Anesthesia Preprocedure Evaluation (Signed)
Anesthesia Evaluation  Patient identified by MRN, date of birth, ID band Patient awake    Reviewed: Allergy & Precautions, NPO status , Patient's Chart, lab work & pertinent test results  Airway Mallampati: II  TM Distance: >3 FB     Dental  (+) Teeth Intact, Implants   Pulmonary former smoker,    breath sounds clear to auscultation       Cardiovascular negative cardio ROS   Rhythm:Regular Rate:Normal     Neuro/Psych    GI/Hepatic GERD  Medicated and Controlled,  Endo/Other    Renal/GU      Musculoskeletal  (+) Arthritis ,   Abdominal   Peds  Hematology   Anesthesia Other Findings   Reproductive/Obstetrics                             Anesthesia Physical Anesthesia Plan  ASA: II  Anesthesia Plan: MAC   Post-op Pain Management:    Induction: Intravenous  Airway Management Planned: Nasal Cannula  Additional Equipment:   Intra-op Plan:   Post-operative Plan:   Informed Consent: I have reviewed the patients History and Physical, chart, labs and discussed the procedure including the risks, benefits and alternatives for the proposed anesthesia with the patient or authorized representative who has indicated his/her understanding and acceptance.     Plan Discussed with:   Anesthesia Plan Comments:         Anesthesia Quick Evaluation

## 2015-08-15 NOTE — Op Note (Signed)
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  Assistants: None  Anesthesia: Topical with MAC  Indications: Painless, progressive loss of vision with compromise of daily activities.  Surgery: Cataract Extraction with Intraocular lens Implant Right Eye  Discription: James Mccall was added to the infusion bottle. The patient had dilating drops and viscous lidocaine placed into the Right eye in the pre-op holding area. After transfer to the operating room, a time out was performed. The patient was then prepped and draped. Beginning with a 21 degree blade a paracentesis port was made at the surgeon's 2 o'clock position. The anterior chamber was then filled with 1% non-preserved lidocaine with epinepherine. This was followed by filling the anterior chamber with Provisc.  A 2.34mm keratome blade was used to make a clear corneal incision at the temporal limbus.  A bent cystatome needle was used to create a continuous tear capsulotomy. Hydrodissection was performed with balanced salt solution on a Fine canula. The lens nucleus was then removed using the phacoemulsification handpiece. Residual cortex was removed with the I&A handpiece. The anterior chamber and capsular bag were refilled with Provisc. A posterior chamber intraocular lens was placed into the capsular bag with it's injector. The implant was positioned with the Kuglan hook. The Provisc was then removed from the anterior chamber and capsular bag with the I&A handpiece. Stromal hydration of the main incision and paracentesis port was performed with BSS on a Fine canula. The wounds were tested for leak which was negative. The patient tolerated the procedure well. There were no operative complications. The patient was then transferred to the recovery room in stable condition.  Complications: None  Specimen:  None  EBL: None  Prosthetic device: Hoya iSert 250, power 21.5 D, SN [id number].

## 2015-08-15 NOTE — Discharge Instructions (Signed)

## 2015-08-16 ENCOUNTER — Encounter (HOSPITAL_COMMUNITY): Payer: Self-pay | Admitting: Ophthalmology

## 2015-08-26 DIAGNOSIS — Z961 Presence of intraocular lens: Secondary | ICD-10-CM | POA: Diagnosis not present

## 2015-08-26 DIAGNOSIS — H40019 Open angle with borderline findings, low risk, unspecified eye: Secondary | ICD-10-CM | POA: Diagnosis not present

## 2015-08-26 DIAGNOSIS — H5202 Hypermetropia, left eye: Secondary | ICD-10-CM | POA: Diagnosis not present

## 2015-08-26 DIAGNOSIS — H25812 Combined forms of age-related cataract, left eye: Secondary | ICD-10-CM | POA: Diagnosis not present

## 2015-08-27 ENCOUNTER — Encounter (HOSPITAL_COMMUNITY): Payer: Self-pay

## 2015-08-27 ENCOUNTER — Encounter (HOSPITAL_COMMUNITY)
Admission: RE | Admit: 2015-08-27 | Discharge: 2015-08-27 | Disposition: A | Discharge status: Home / Self Care | Payer: Medicare Other | Source: Ambulatory Visit | Attending: Ophthalmology | Admitting: Ophthalmology

## 2015-08-30 MED ORDER — PHENYLEPHRINE HCL 2.5 % OP SOLN
OPHTHALMIC | Status: AC
  Filled 2015-08-30: qty 15

## 2015-08-30 MED ORDER — CYCLOPENTOLATE-PHENYLEPHRINE OP SOLN OPTIME - NO CHARGE
OPHTHALMIC | Status: AC
  Filled 2015-08-30: qty 2

## 2015-08-30 MED ORDER — LIDOCAINE HCL 3.5 % OP GEL
OPHTHALMIC | Status: AC
  Filled 2015-08-30: qty 1

## 2015-08-30 MED ORDER — TETRACAINE HCL 0.5 % OP SOLN
OPHTHALMIC | Status: AC
  Filled 2015-08-30: qty 2

## 2015-08-30 MED ORDER — LIDOCAINE HCL (PF) 1 % IJ SOLN
INTRAMUSCULAR | Status: AC
  Filled 2015-08-30: qty 2

## 2015-08-30 MED ORDER — NEOMYCIN-POLYMYXIN-DEXAMETH 3.5-10000-0.1 OP SUSP
OPHTHALMIC | Status: AC
  Filled 2015-08-30: qty 5

## 2015-09-02 ENCOUNTER — Ambulatory Visit (HOSPITAL_COMMUNITY): Payer: Medicare Other | Admitting: Anesthesiology

## 2015-09-02 ENCOUNTER — Encounter (HOSPITAL_COMMUNITY): Payer: Self-pay | Admitting: *Deleted

## 2015-09-02 ENCOUNTER — Ambulatory Visit (HOSPITAL_COMMUNITY)
Admission: RE | Admit: 2015-09-02 | Discharge: 2015-09-02 | Disposition: A | Discharge status: Home / Self Care | Payer: Medicare Other | Source: Ambulatory Visit | Attending: Ophthalmology | Admitting: Ophthalmology

## 2015-09-02 ENCOUNTER — Encounter (HOSPITAL_COMMUNITY)
Admission: RE | Disposition: A | Discharge status: Home / Self Care | Payer: Self-pay | Source: Ambulatory Visit | Attending: Ophthalmology

## 2015-09-02 DIAGNOSIS — K219 Gastro-esophageal reflux disease without esophagitis: Secondary | ICD-10-CM | POA: Insufficient documentation

## 2015-09-02 DIAGNOSIS — Z87891 Personal history of nicotine dependence: Secondary | ICD-10-CM | POA: Diagnosis not present

## 2015-09-02 DIAGNOSIS — Z7951 Long term (current) use of inhaled steroids: Secondary | ICD-10-CM | POA: Insufficient documentation

## 2015-09-02 DIAGNOSIS — H25812 Combined forms of age-related cataract, left eye: Secondary | ICD-10-CM | POA: Diagnosis not present

## 2015-09-02 DIAGNOSIS — M1990 Unspecified osteoarthritis, unspecified site: Secondary | ICD-10-CM | POA: Insufficient documentation

## 2015-09-02 DIAGNOSIS — H269 Unspecified cataract: Secondary | ICD-10-CM | POA: Diagnosis not present

## 2015-09-02 DIAGNOSIS — Z79899 Other long term (current) drug therapy: Secondary | ICD-10-CM | POA: Insufficient documentation

## 2015-09-02 HISTORY — PX: CATARACT EXTRACTION W/PHACO: SHX586

## 2015-09-02 SURGERY — PHACOEMULSIFICATION, CATARACT, WITH IOL INSERTION
Anesthesia: Monitor Anesthesia Care | Site: Eye | Laterality: Left

## 2015-09-02 MED ORDER — ONDANSETRON HCL 4 MG/2ML IJ SOLN: 4.0000 mg | Freq: Once | INTRAMUSCULAR | Status: DC | PRN

## 2015-09-02 MED ORDER — EPINEPHRINE HCL 1 MG/ML IJ SOLN
INTRAMUSCULAR | Status: AC
  Filled 2015-09-02: qty 1

## 2015-09-02 MED ORDER — FENTANYL CITRATE (PF) 100 MCG/2ML IJ SOLN: 25.0000 ug | INTRAMUSCULAR | Status: DC | PRN

## 2015-09-02 MED ORDER — MIDAZOLAM HCL 2 MG/2ML IJ SOLN
1.0000 mg | INTRAMUSCULAR | Status: DC | PRN
  Administered 2015-09-02: 2 mg via INTRAVENOUS
  Filled 2015-09-02: qty 2

## 2015-09-02 MED ORDER — FENTANYL CITRATE (PF) 100 MCG/2ML IJ SOLN
25.0000 ug | INTRAMUSCULAR | Status: AC
  Administered 2015-09-02 (×2): 25 ug via INTRAVENOUS
  Filled 2015-09-02: qty 2

## 2015-09-02 MED ORDER — TETRACAINE HCL 0.5 % OP SOLN
1.0000 [drp] | OPHTHALMIC | Status: AC
  Administered 2015-09-02 (×3): 1 [drp] via OPHTHALMIC

## 2015-09-02 MED ORDER — LIDOCAINE HCL (PF) 1 % IJ SOLN
INTRAOCULAR | Status: DC | PRN
  Administered 2015-09-02: .5 mL via OPHTHALMIC

## 2015-09-02 MED ORDER — BSS IO SOLN
INTRAOCULAR | Status: DC | PRN
  Administered 2015-09-02: 15 mL

## 2015-09-02 MED ORDER — EPINEPHRINE HCL 1 MG/ML IJ SOLN
INTRAMUSCULAR | Status: DC | PRN
  Administered 2015-09-02: 500 mL

## 2015-09-02 MED ORDER — LACTATED RINGERS IV SOLN
INTRAVENOUS | Status: DC
  Administered 2015-09-02: 1000 mL via INTRAVENOUS

## 2015-09-02 MED ORDER — PROVISC 10 MG/ML IO SOLN
INTRAOCULAR | Status: DC | PRN
  Administered 2015-09-02: 0.85 mL via INTRAOCULAR

## 2015-09-02 MED ORDER — CYCLOPENTOLATE-PHENYLEPHRINE 0.2-1 % OP SOLN
1.0000 [drp] | OPHTHALMIC | Status: AC
  Administered 2015-09-02 (×3): 1 [drp] via OPHTHALMIC

## 2015-09-02 MED ORDER — PHENYLEPHRINE HCL 2.5 % OP SOLN
1.0000 [drp] | OPHTHALMIC | Status: AC
  Administered 2015-09-02 (×3): 1 [drp] via OPHTHALMIC

## 2015-09-02 MED ORDER — NEOMYCIN-POLYMYXIN-DEXAMETH 3.5-10000-0.1 OP SUSP
OPHTHALMIC | Status: DC | PRN
  Administered 2015-09-02: 2 [drp] via OPHTHALMIC

## 2015-09-02 MED ORDER — POVIDONE-IODINE 5 % OP SOLN
OPHTHALMIC | Status: DC | PRN
  Administered 2015-09-02: 1 via OPHTHALMIC

## 2015-09-02 MED ORDER — LIDOCAINE HCL 3.5 % OP GEL
1.0000 "application " | Freq: Once | OPHTHALMIC | Status: AC
  Administered 2015-09-02: 1 via OPHTHALMIC

## 2015-09-02 SURGICAL SUPPLY — 12 items

## 2015-09-02 NOTE — Anesthesia Preprocedure Evaluation (Signed)
Anesthesia Evaluation  Patient identified by MRN, date of birth, ID band Patient awake    Reviewed: Allergy & Precautions, NPO status , Patient's Chart, lab work & pertinent test results  Airway Mallampati: II  TM Distance: >3 FB     Dental  (+) Teeth Intact, Implants   Pulmonary former smoker,    breath sounds clear to auscultation       Cardiovascular negative cardio ROS   Rhythm:Regular Rate:Normal     Neuro/Psych    GI/Hepatic GERD  Medicated and Controlled,  Endo/Other    Renal/GU      Musculoskeletal  (+) Arthritis ,   Abdominal   Peds  Hematology   Anesthesia Other Findings   Reproductive/Obstetrics                             Anesthesia Physical Anesthesia Plan  ASA: II  Anesthesia Plan: MAC   Post-op Pain Management:    Induction: Intravenous  Airway Management Planned: Nasal Cannula  Additional Equipment:   Intra-op Plan:   Post-operative Plan:   Informed Consent: I have reviewed the patients History and Physical, chart, labs and discussed the procedure including the risks, benefits and alternatives for the proposed anesthesia with the patient or authorized representative who has indicated his/her understanding and acceptance.     Plan Discussed with:   Anesthesia Plan Comments:         Anesthesia Quick Evaluation

## 2015-09-02 NOTE — Transfer of Care (Signed)
Immediate Anesthesia Transfer of Care Note  Patient: James Mccall  Procedure(s) Performed: Procedure(s): CATARACT EXTRACTION PHACO AND INTRAOCULAR LENS PLACEMENT LEFT EYE CDE=5.93 (Left)  Patient Location: Short Stay  Anesthesia Type:MAC  Level of Consciousness: awake, alert , oriented and patient cooperative  Airway & Oxygen Therapy: Patient Spontanous Breathing  Post-op Assessment: Report given to RN and Post -op Vital signs reviewed and stable  Post vital signs: Reviewed and stable  Last Vitals:  Filed Vitals:   09/02/15 1205  BP: 146/77  Resp: 17    Complications: No apparent anesthesia complications

## 2015-09-02 NOTE — Op Note (Signed)
Date of Admission: 09/02/2015  Date of Surgery: 09/02/2015   Pre-Op Dx: Cataract Left Eye  Post-Op Dx: Senile Combined Cataract Left  Eye,  Dx Code W73.710  Surgeon: Tonny Branch, M.D.  Assistants: None  Anesthesia: Topical with MAC  Indications: Painless, progressive loss of vision with compromise of daily activities.  Surgery: Cataract Extraction with Intraocular lens Implant Left Eye  Discription: The patient had dilating drops and viscous lidocaine placed into the Left eye in the pre-op holding area. After transfer to the operating room, a time out was performed. The patient was then prepped and draped. Beginning with a 74 degree blade a paracentesis port was made at the surgeon's 2 o'clock position. The anterior chamber was then filled with 1% non-preserved lidocaine with epinepherine. This was followed by filling the anterior chamber with Provisc.  A 2.65mm keratome blade was used to make a clear corneal incision at the temporal limbus.  A bent cystatome needle was used to create a continuous tear capsulotomy. Hydrodissection was performed with balanced salt solution on a Fine canula. The lens nucleus was then removed using the phacoemulsification handpiece. Residual cortex was removed with the I&A handpiece. The anterior chamber and capsular bag were refilled with Provisc. A posterior chamber intraocular lens was placed into the capsular bag with it's injector. The implant was positioned with the Kuglan hook. The Provisc was then removed from the anterior chamber and capsular bag with the I&A handpiece. Stromal hydration of the main incision and paracentesis port was performed with BSS on a Fine canula. The wounds were tested for leak which was negative. The patient tolerated the procedure well. There were no operative complications. The patient was then transferred to the recovery room in stable condition.  Complications: None  Specimen: None  EBL: None  Prosthetic device: Hoya iSert  250, power 21.5 D, SN [id number].

## 2015-09-02 NOTE — Anesthesia Postprocedure Evaluation (Signed)
  Anesthesia Post-op Note  Patient: James Mccall  Procedure(s) Performed: Procedure(s): CATARACT EXTRACTION PHACO AND INTRAOCULAR LENS PLACEMENT LEFT EYE CDE=5.93 (Left)  Patient Location: Short Stay  Anesthesia Type:MAC  Level of Consciousness: awake, alert , oriented and patient cooperative  Airway and Oxygen Therapy: Patient Spontanous Breathing  Post-op Pain: none  Post-op Assessment: Post-op Vital signs reviewed, Patient's Cardiovascular Status Stable, Respiratory Function Stable, Patent Airway, No signs of Nausea or vomiting and Pain level controlled              Post-op Vital Signs: Reviewed and stable  Last Vitals:  Filed Vitals:   09/02/15 1205  BP: 146/77  Resp: 17    Complications: No apparent anesthesia complications

## 2015-09-02 NOTE — Consult Note (Signed)
I have reviewed the H&P, the patient was re-examined, and I have identified no interval changes in medical condition and plan of care since the history and physical of record  

## 2015-09-02 NOTE — Anesthesia Procedure Notes (Signed)
Procedure Name: MAC Date/Time: 09/02/2015 12:25 PM Performed by: Andree Elk, Ramon Brant A Pre-anesthesia Checklist: Patient identified, Timeout performed, Emergency Drugs available, Suction available and Patient being monitored Oxygen Delivery Method: Nasal cannula

## 2015-09-02 NOTE — Discharge Instructions (Signed)

## 2015-09-03 ENCOUNTER — Encounter (HOSPITAL_COMMUNITY): Payer: Self-pay | Admitting: Ophthalmology

## 2015-10-09 DIAGNOSIS — N3289 Other specified disorders of bladder: Secondary | ICD-10-CM | POA: Diagnosis not present

## 2015-10-09 DIAGNOSIS — I10 Essential (primary) hypertension: Secondary | ICD-10-CM | POA: Diagnosis not present

## 2015-10-09 DIAGNOSIS — E782 Mixed hyperlipidemia: Secondary | ICD-10-CM | POA: Diagnosis not present

## 2015-10-09 DIAGNOSIS — K219 Gastro-esophageal reflux disease without esophagitis: Secondary | ICD-10-CM | POA: Diagnosis not present

## 2015-10-09 DIAGNOSIS — Z1389 Encounter for screening for other disorder: Secondary | ICD-10-CM | POA: Diagnosis not present

## 2015-10-14 DIAGNOSIS — J069 Acute upper respiratory infection, unspecified: Secondary | ICD-10-CM | POA: Diagnosis not present

## 2016-04-07 DIAGNOSIS — Z1389 Encounter for screening for other disorder: Secondary | ICD-10-CM | POA: Diagnosis not present

## 2016-04-07 DIAGNOSIS — J069 Acute upper respiratory infection, unspecified: Secondary | ICD-10-CM | POA: Diagnosis not present

## 2016-04-07 DIAGNOSIS — J209 Acute bronchitis, unspecified: Secondary | ICD-10-CM | POA: Diagnosis not present

## 2016-04-16 IMAGING — MR MR CERVICAL SPINE W/O CM
4 of 5 series · 14 of 48 positions shown · non-contrast
Comparison: 05/21/2015

CLINICAL DATA: Left neck pain and stiffness.  Symptoms for 1 year.

EXAM:
MRI CERVICAL SPINE WITHOUT CONTRAST
TECHNIQUE: Multiplanar, multisequence MR imaging of the cervical spine was
performed. No intravenous contrast was administered.

[Series 3: T2 · sagittal · 3.0mm · 0.38mm/px · 5 of 13 slices shown (1 of 2)]
[im 1/13]
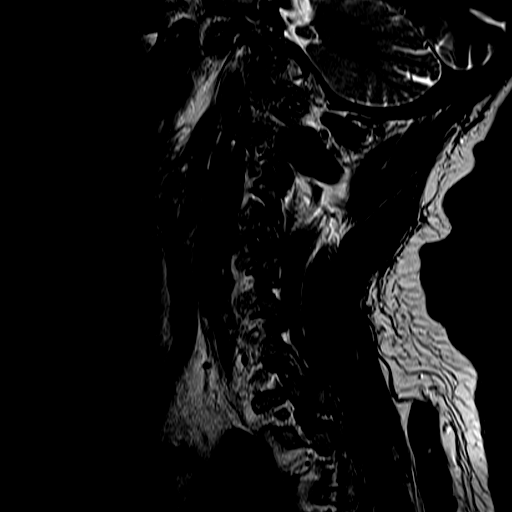
[im 4/13]
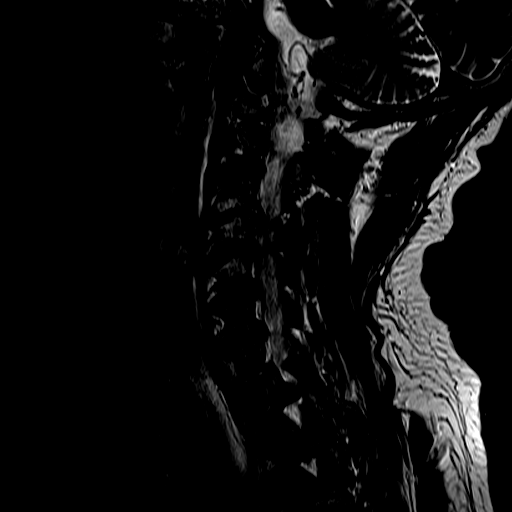
[im 7/13]
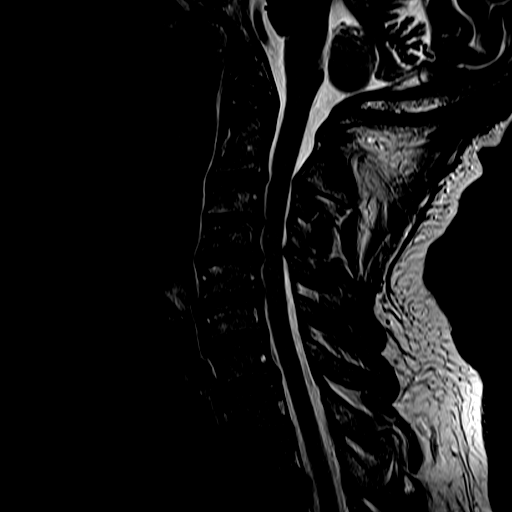
[im 10/13]
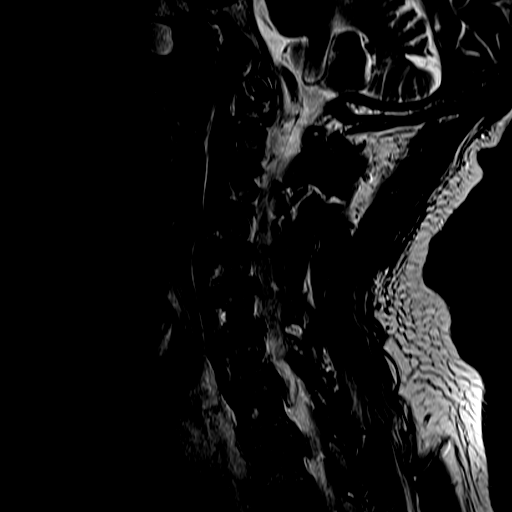
[im 13/13]
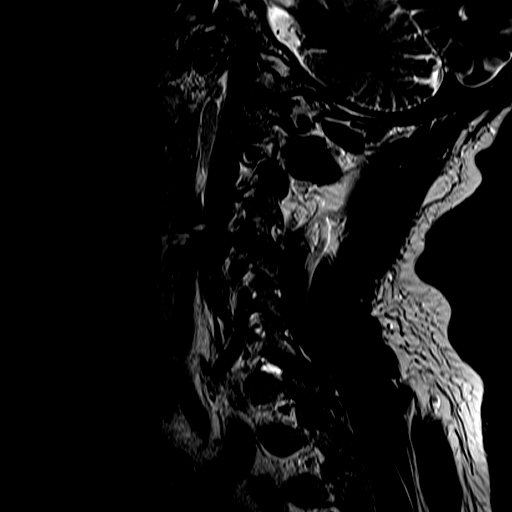

[Series 4: FLAIR · sagittal · 3.0mm · 0.44mm/px · 3 of 13 slices shown]
[im 1/13]
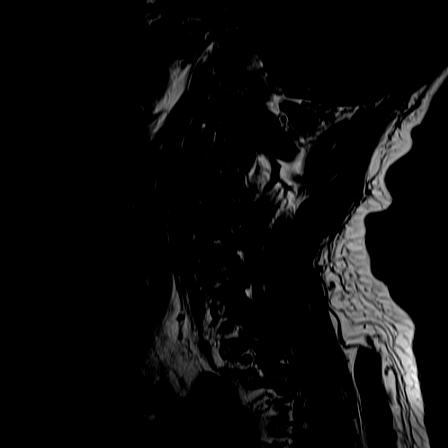
[im 7/13]
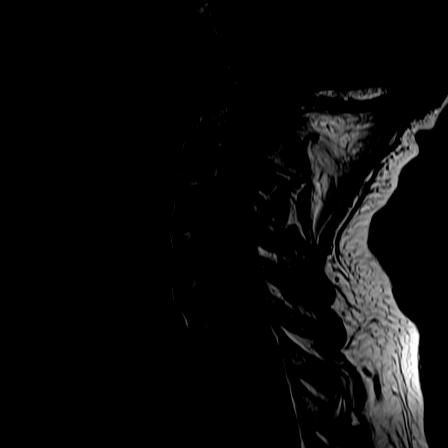
[im 13/13]
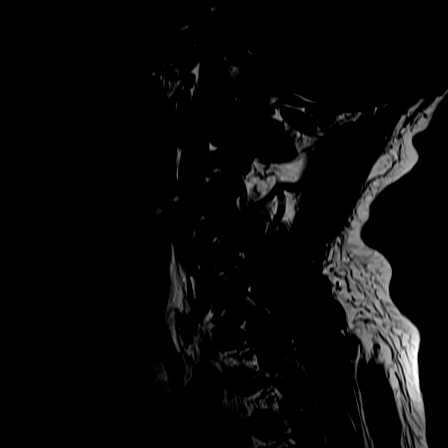

[Series 5: ir sagital · sagittal · 3.0mm · 0.22mm/px · 3 of 13 slices shown]
[im 3/13]
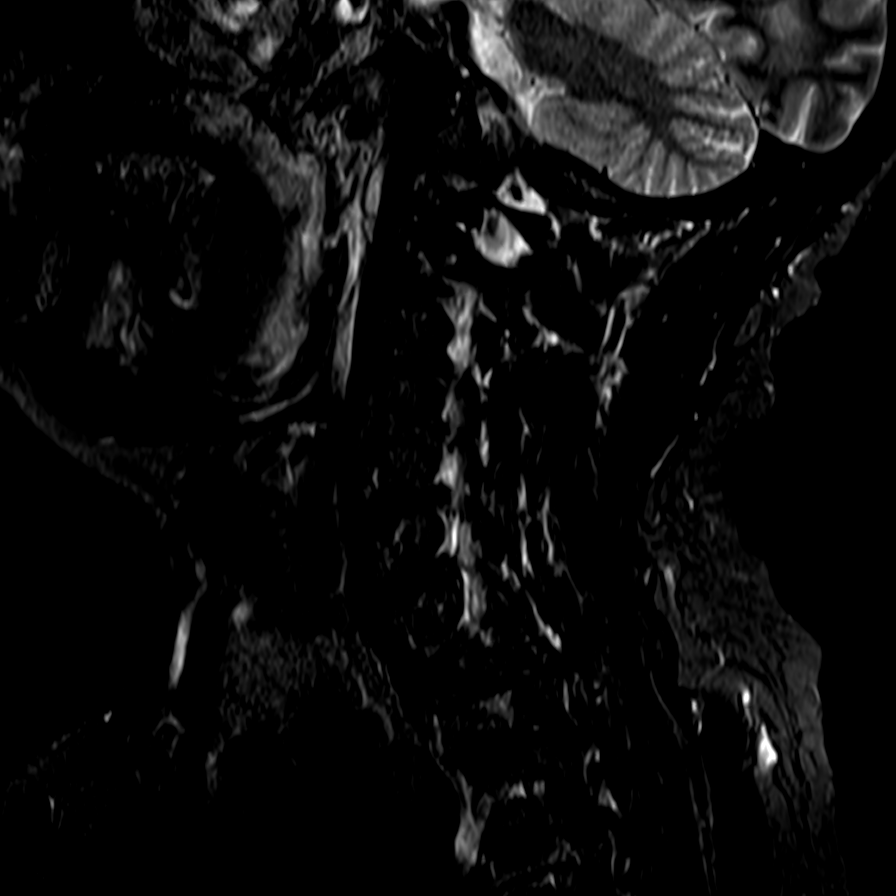
[im 8/13]
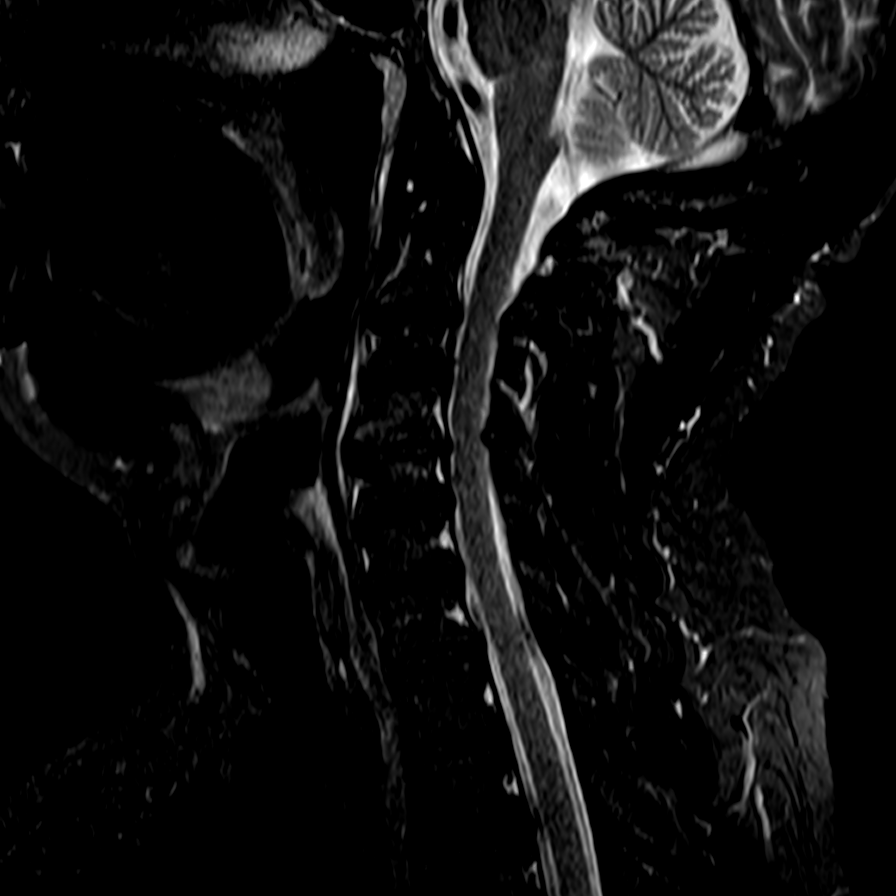
[im 13/13]
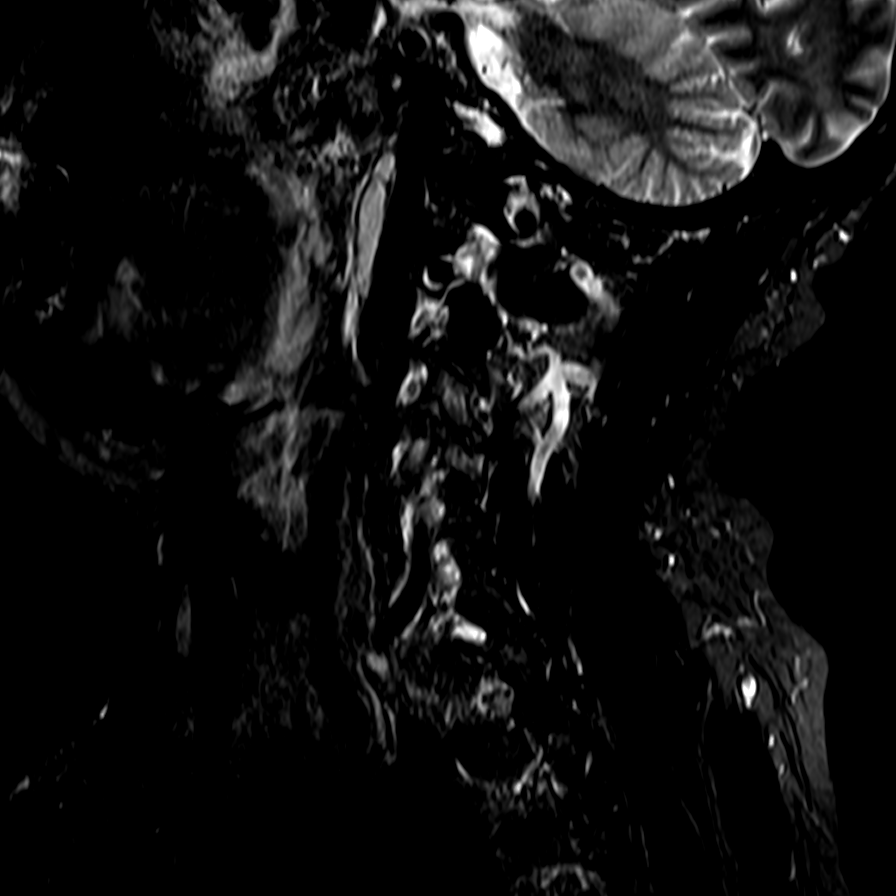

[Series 7: T2 · axial · 3.0mm · 0.20mm/px · z∈[-68,+13]mm · 3 of 36 slices shown (2 of 2)]
[im 5/36]
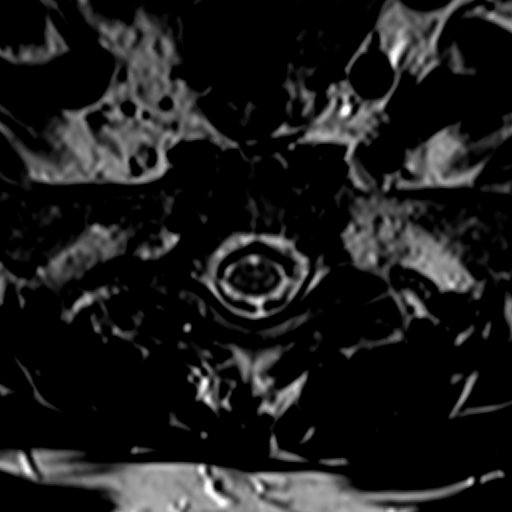
[im 19/36]
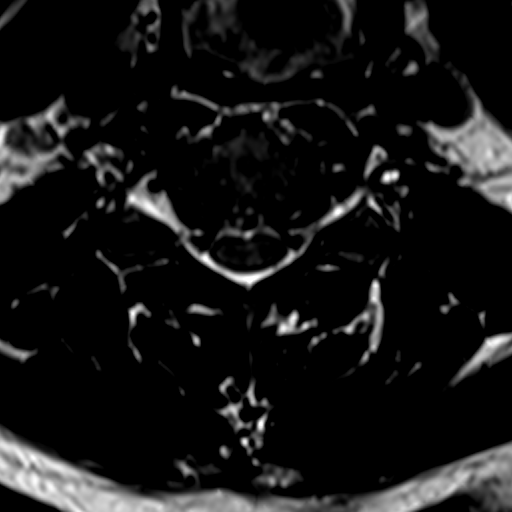
[im 31/36]
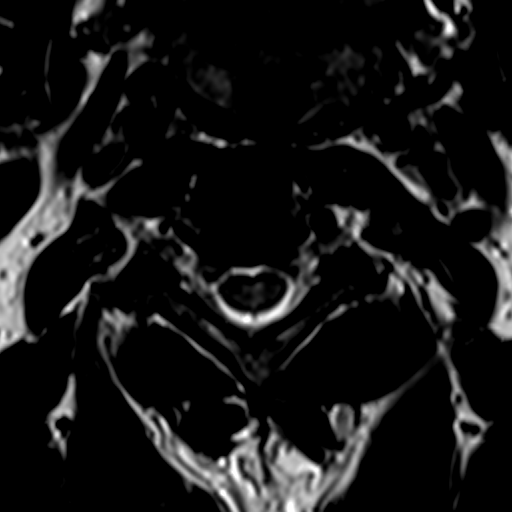

[14 of 48 positions shown; findings below may reference images not displayed]

FINDINGS: Chronic sphenoid and right maxillary sinusitis.

The craniocervical junction appears unremarkable. Diminutive right
vertebral artery.

No significant abnormal spinal cord signal is observed.
Intervertebral disc desiccation is observed at all levels in the
cervical spine with loss of intervertebral disc height especially at
C4-5 but also at C3-4, C5-6, and C6-7.

2 mm grade 1 anterolisthesis at C7-T1. Type 3 degenerative endplate
findings at C4-5.

Additional findings at individual levels are as follows:

C2-3: Mild central narrowing of the thecal sac due to disc bulge.
Bilateral uncinate spurring.

C3-4: Moderate to prominent right and borderline left foraminal
stenosis with mild central narrowing of the thecal sac due to
uncinate spurring, disc bulge, and facet arthropathy.

C4-5: Moderate to severe bilateral foraminal stenosis and moderate
central narrowing of the thecal sac due to uncinate spurring, disc
bulge, and facet arthropathy.

C5-6: Moderate left and mild to moderate right foraminal stenosis
and mild central narrowing of the thecal sac due to disc bulge,
uncinate spurring, and left facet arthropathy.

C6-7: Mild bilateral foraminal stenosis due to uncinate spurring.
Disc bulge noted. Possible left foraminal disc protrusion.

C7-T1: Mild right and borderline left foraminal stenosis due to
facet arthropathy and disc bulge. Small central disc protrusion.
IMPRESSION: 1. Cervical spondylosis and degenerative disc disease, causing
moderate to prominent impingement at C3-4 and C4-5; moderate
impingement at C5-6 ; and mild impingement at C2-3, C6-7, and C7-T1,
as detailed above.
2. Chronic sphenoid and chronic right maxillary sinusitis.

## 2016-06-12 DIAGNOSIS — J302 Other seasonal allergic rhinitis: Secondary | ICD-10-CM | POA: Diagnosis not present

## 2016-06-12 DIAGNOSIS — Z1389 Encounter for screening for other disorder: Secondary | ICD-10-CM | POA: Diagnosis not present

## 2016-06-12 DIAGNOSIS — J310 Chronic rhinitis: Secondary | ICD-10-CM | POA: Diagnosis not present

## 2016-06-19 DIAGNOSIS — N401 Enlarged prostate with lower urinary tract symptoms: Secondary | ICD-10-CM | POA: Diagnosis not present

## 2016-06-19 DIAGNOSIS — R350 Frequency of micturition: Secondary | ICD-10-CM | POA: Diagnosis not present

## 2016-07-31 DIAGNOSIS — Z1389 Encounter for screening for other disorder: Secondary | ICD-10-CM | POA: Diagnosis not present

## 2016-07-31 DIAGNOSIS — Z0000 Encounter for general adult medical examination without abnormal findings: Secondary | ICD-10-CM | POA: Diagnosis not present

## 2016-07-31 DIAGNOSIS — K219 Gastro-esophageal reflux disease without esophagitis: Secondary | ICD-10-CM | POA: Diagnosis not present

## 2016-07-31 DIAGNOSIS — I10 Essential (primary) hypertension: Secondary | ICD-10-CM | POA: Diagnosis not present

## 2016-08-03 DIAGNOSIS — Z0000 Encounter for general adult medical examination without abnormal findings: Secondary | ICD-10-CM | POA: Diagnosis not present

## 2016-08-03 DIAGNOSIS — Z1389 Encounter for screening for other disorder: Secondary | ICD-10-CM | POA: Diagnosis not present

## 2016-08-08 DIAGNOSIS — S61211A Laceration without foreign body of left index finger without damage to nail, initial encounter: Secondary | ICD-10-CM | POA: Diagnosis not present

## 2016-08-08 DIAGNOSIS — W312XXA Contact with powered woodworking and forming machines, initial encounter: Secondary | ICD-10-CM | POA: Diagnosis not present

## 2016-08-08 DIAGNOSIS — S68121A Partial traumatic metacarpophalangeal amputation of left index finger, initial encounter: Secondary | ICD-10-CM | POA: Diagnosis not present

## 2016-10-01 DIAGNOSIS — R972 Elevated prostate specific antigen [PSA]: Secondary | ICD-10-CM | POA: Diagnosis not present

## 2016-10-02 DIAGNOSIS — R972 Elevated prostate specific antigen [PSA]: Secondary | ICD-10-CM | POA: Diagnosis not present

## 2016-12-31 DIAGNOSIS — R972 Elevated prostate specific antigen [PSA]: Secondary | ICD-10-CM | POA: Diagnosis not present

## 2017-01-08 DIAGNOSIS — R972 Elevated prostate specific antigen [PSA]: Secondary | ICD-10-CM | POA: Diagnosis not present

## 2017-01-08 DIAGNOSIS — R350 Frequency of micturition: Secondary | ICD-10-CM | POA: Diagnosis not present

## 2017-01-08 DIAGNOSIS — N401 Enlarged prostate with lower urinary tract symptoms: Secondary | ICD-10-CM | POA: Diagnosis not present

## 2017-01-12 DIAGNOSIS — H52221 Regular astigmatism, right eye: Secondary | ICD-10-CM | POA: Diagnosis not present

## 2017-01-12 DIAGNOSIS — H5212 Myopia, left eye: Secondary | ICD-10-CM | POA: Diagnosis not present

## 2017-01-12 DIAGNOSIS — H524 Presbyopia: Secondary | ICD-10-CM | POA: Diagnosis not present

## 2017-01-21 DIAGNOSIS — Z87448 Personal history of other diseases of urinary system: Secondary | ICD-10-CM

## 2017-01-21 DIAGNOSIS — Z87440 Personal history of urinary (tract) infections: Secondary | ICD-10-CM

## 2017-01-21 HISTORY — DX: Personal history of urinary (tract) infections: Z87.440

## 2017-01-21 HISTORY — DX: Personal history of other diseases of urinary system: Z87.448

## 2017-01-25 ENCOUNTER — Emergency Department (HOSPITAL_COMMUNITY): Payer: Medicare Other

## 2017-01-25 ENCOUNTER — Inpatient Hospital Stay (HOSPITAL_COMMUNITY)
Admission: EM | Admit: 2017-01-25 | Discharge: 2017-01-28 | DRG: 872 | Disposition: A | Discharge status: Home / Self Care | Payer: Medicare Other | Attending: Internal Medicine | Admitting: Internal Medicine

## 2017-01-25 ENCOUNTER — Encounter (HOSPITAL_COMMUNITY): Payer: Self-pay | Admitting: Emergency Medicine

## 2017-01-25 DIAGNOSIS — B952 Enterococcus as the cause of diseases classified elsewhere: Secondary | ICD-10-CM | POA: Diagnosis not present

## 2017-01-25 DIAGNOSIS — N201 Calculus of ureter: Secondary | ICD-10-CM | POA: Diagnosis present

## 2017-01-25 DIAGNOSIS — N401 Enlarged prostate with lower urinary tract symptoms: Secondary | ICD-10-CM

## 2017-01-25 DIAGNOSIS — B349 Viral infection, unspecified: Secondary | ICD-10-CM | POA: Diagnosis not present

## 2017-01-25 DIAGNOSIS — R109 Unspecified abdominal pain: Secondary | ICD-10-CM | POA: Diagnosis not present

## 2017-01-25 DIAGNOSIS — J302 Other seasonal allergic rhinitis: Secondary | ICD-10-CM | POA: Diagnosis not present

## 2017-01-25 DIAGNOSIS — N400 Enlarged prostate without lower urinary tract symptoms: Secondary | ICD-10-CM | POA: Diagnosis present

## 2017-01-25 DIAGNOSIS — A408 Other streptococcal sepsis: Secondary | ICD-10-CM | POA: Diagnosis not present

## 2017-01-25 DIAGNOSIS — Z791 Long term (current) use of non-steroidal anti-inflammatories (NSAID): Secondary | ICD-10-CM

## 2017-01-25 DIAGNOSIS — N200 Calculus of kidney: Secondary | ICD-10-CM | POA: Diagnosis not present

## 2017-01-25 DIAGNOSIS — N10 Acute tubulo-interstitial nephritis: Secondary | ICD-10-CM | POA: Diagnosis present

## 2017-01-25 DIAGNOSIS — N202 Calculus of kidney with calculus of ureter: Secondary | ICD-10-CM | POA: Diagnosis present

## 2017-01-25 DIAGNOSIS — A419 Sepsis, unspecified organism: Secondary | ICD-10-CM | POA: Diagnosis not present

## 2017-01-25 DIAGNOSIS — E785 Hyperlipidemia, unspecified: Secondary | ICD-10-CM | POA: Diagnosis present

## 2017-01-25 DIAGNOSIS — K5900 Constipation, unspecified: Secondary | ICD-10-CM | POA: Diagnosis not present

## 2017-01-25 DIAGNOSIS — K219 Gastro-esophageal reflux disease without esophagitis: Secondary | ICD-10-CM | POA: Diagnosis present

## 2017-01-25 DIAGNOSIS — Z87891 Personal history of nicotine dependence: Secondary | ICD-10-CM | POA: Diagnosis not present

## 2017-01-25 DIAGNOSIS — N390 Urinary tract infection, site not specified: Secondary | ICD-10-CM | POA: Diagnosis not present

## 2017-01-25 DIAGNOSIS — N12 Tubulo-interstitial nephritis, not specified as acute or chronic: Secondary | ICD-10-CM | POA: Diagnosis present

## 2017-01-25 DIAGNOSIS — R1031 Right lower quadrant pain: Secondary | ICD-10-CM | POA: Diagnosis not present

## 2017-01-25 DIAGNOSIS — N179 Acute kidney failure, unspecified: Secondary | ICD-10-CM | POA: Diagnosis present

## 2017-01-25 DIAGNOSIS — Z1389 Encounter for screening for other disorder: Secondary | ICD-10-CM | POA: Diagnosis not present

## 2017-01-25 DIAGNOSIS — R509 Fever, unspecified: Secondary | ICD-10-CM | POA: Diagnosis not present

## 2017-01-25 LAB — COMPREHENSIVE METABOLIC PANEL
ALK PHOS: 65 U/L (ref 38–126)
ALT: 9 U/L — AB (ref 17–63)
AST: 22 U/L (ref 15–41)
Albumin: 3.4 g/dL — ABNORMAL LOW (ref 3.5–5.0)
Anion gap: 10 (ref 5–15)
BILIRUBIN TOTAL: 1.2 mg/dL (ref 0.3–1.2)
BUN: 27 mg/dL — ABNORMAL HIGH (ref 6–20)
CO2: 21 mmol/L — ABNORMAL LOW (ref 22–32)
Calcium: 8.7 mg/dL — ABNORMAL LOW (ref 8.9–10.3)
Chloride: 99 mmol/L — ABNORMAL LOW (ref 101–111)
Creatinine, Ser: 1.47 mg/dL — ABNORMAL HIGH (ref 0.61–1.24)
GFR calc Af Amer: 54 mL/min — ABNORMAL LOW (ref >60)
GFR, EST NON AFRICAN AMERICAN: 46 mL/min — AB (ref >60)
Glucose, Bld: 113 mg/dL — ABNORMAL HIGH (ref 65–99)
Potassium: 4.5 mmol/L (ref 3.5–5.1)
Sodium: 130 mmol/L — ABNORMAL LOW (ref 135–145)
TOTAL PROTEIN: 7.7 g/dL (ref 6.5–8.1)

## 2017-01-25 LAB — URINALYSIS, ROUTINE W REFLEX MICROSCOPIC
BILIRUBIN URINE: NEGATIVE
Glucose, UA: NEGATIVE mg/dL
KETONES UR: NEGATIVE mg/dL
Nitrite: NEGATIVE
PH: 5 (ref 5.0–8.0)
Protein, ur: 30 mg/dL — AB
SPECIFIC GRAVITY, URINE: 1.021 (ref 1.005–1.030)

## 2017-01-25 LAB — CBC
HCT: 39.2 % (ref 39.0–52.0)
HEMOGLOBIN: 13.3 g/dL (ref 13.0–17.0)
MCH: 23.6 pg — AB (ref 26.0–34.0)
MCHC: 33.9 g/dL (ref 30.0–36.0)
MCV: 69.6 fL — AB (ref 78.0–100.0)
Platelets: 148 10*3/uL — ABNORMAL LOW (ref 150–400)
RBC: 5.63 MIL/uL (ref 4.22–5.81)
RDW: 14.9 % (ref 11.5–15.5)
WBC: 15.7 10*3/uL — AB (ref 4.0–10.5)

## 2017-01-25 LAB — LIPASE, BLOOD: Lipase: 13 U/L (ref 11–51)

## 2017-01-25 MED ORDER — IOPAMIDOL (ISOVUE-300) INJECTION 61%
INTRAVENOUS | Status: AC
  Filled 2017-01-25: qty 30

## 2017-01-25 MED ORDER — ONDANSETRON HCL 4 MG/2ML IJ SOLN
4.0000 mg | Freq: Once | INTRAMUSCULAR | Status: AC
  Administered 2017-01-25: 4 mg via INTRAVENOUS
  Filled 2017-01-25: qty 2

## 2017-01-25 MED ORDER — ACETAMINOPHEN 325 MG PO TABS
650.0000 mg | ORAL_TABLET | Freq: Once | ORAL | Status: AC
  Administered 2017-01-25: 650 mg via ORAL
  Filled 2017-01-25: qty 2

## 2017-01-25 MED ORDER — SODIUM CHLORIDE 0.9 % IV BOLUS (SEPSIS)
1000.0000 mL | Freq: Once | INTRAVENOUS | Status: AC
  Administered 2017-01-25: 1000 mL via INTRAVENOUS

## 2017-01-25 MED ORDER — CEFTRIAXONE SODIUM 1 G IJ SOLR
1.0000 g | Freq: Once | INTRAMUSCULAR | Status: AC
  Administered 2017-01-25: 1 g via INTRAVENOUS
  Filled 2017-01-25: qty 10

## 2017-01-25 MED ORDER — IOPAMIDOL (ISOVUE-300) INJECTION 61%
100.0000 mL | Freq: Once | INTRAVENOUS | Status: AC | PRN
  Administered 2017-01-25: 100 mL via INTRAVENOUS

## 2017-01-25 MED ORDER — ACETAMINOPHEN 500 MG PO TABS
1000.0000 mg | ORAL_TABLET | Freq: Once | ORAL | Status: AC
  Administered 2017-01-25: 1000 mg via ORAL
  Filled 2017-01-25: qty 2

## 2017-01-25 NOTE — ED Provider Notes (Signed)
[name] DEPT Provider Note   CSN: [id number] Arrival date & time: 01/25/17  1418     History   Chief Complaint Chief Complaint  Patient presents with  . Abdominal Pain    HPI James Mccall is a 71 y.o. male.  HPI Patient presents with abdominal pain. All began around a week ago feeling bad. States it started by aching in his joints. Now is had fevers. Has had some nausea. Decreased appetite. Has had some pain in his abdomen now. Mild urinary frequency. No sick contacts. Saw his primary care doctor sent in to the hospital.   Past Medical History:  Diagnosis Date  . Arthritis   . BPH (benign prostatic hyperplasia)   . GERD (gastroesophageal reflux disease)   . Hyperlipidemia   . Seasonal allergies     Patient Active Problem List   Diagnosis Date Noted  . Rectal bleeding 08/21/2013    Past Surgical History:  Procedure Laterality Date  . CATARACT EXTRACTION W/PHACO Right 08/15/2015   Procedure: CATARACT EXTRACTION PHACO AND INTRAOCULAR LENS PLACEMENT (IOC);  Surgeon: Tonny Branch, MD;  Location: [facility]S;  Service: Ophthalmology;  Laterality: Right;  CDE:5.75  . CATARACT EXTRACTION W/PHACO Left 09/02/2015   Procedure: CATARACT EXTRACTION PHACO AND INTRAOCULAR LENS PLACEMENT LEFT EYE CDE=5.93;  Surgeon: Tonny Branch, MD;  Location: [facility] ORS;  Service: Ophthalmology;  Laterality: Left;  . COLONOSCOPY  06/30/04   [id number] hemorrhoids/pedunculated polyp in the rectum/left-sided and transverse diverticula  . COLONOSCOPY N/A 08/28/2013   Procedure: COLONOSCOPY;  Surgeon: Daneil Dolin, MD;  Location: [facility] SUITE;  Service: Endoscopy;  Laterality: N/A;  9:45  . NASAL SINUS SURGERY    . shoulder sugery Bilateral    rotator cuff  . vocal cord polyps         Home Medications    Prior to Admission medications   Medication Sig Start Date End Date Taking? Authorizing Provider  diphenhydramine-acetaminophen (TYLENOL PM) 25-500 MG TABS Take 1-2 tablets by mouth at bedtime  as needed (sleep).   Yes Historical Provider, MD  esomeprazole (NEXIUM) 40 MG capsule Take 40 mg by mouth daily.  01/09/17  Yes Historical Provider, MD  ipratropium (ATROVENT) 0.03 % nasal spray Place 1 spray into both nostrils 2 (two) times daily.  12/01/16  Yes Historical Provider, MD  meloxicam (MOBIC) 7.5 MG tablet Take 15 mg by mouth daily.  08/15/13  Yes Historical Provider, MD  montelukast (SINGULAIR) 10 MG tablet Take 10 mg by mouth at bedtime.  12/14/16  Yes Historical Provider, MD  simvastatin (ZOCOR) 20 MG tablet Take 20 mg by mouth daily.  08/15/13  Yes Historical Provider, MD  tamsulosin (FLOMAX) 0.4 MG CAPS capsule Take 0.8 mg by mouth daily.  08/15/13  Yes Historical Provider, MD    Family History Family History  Problem Relation Age of Onset  . Colon cancer Neg Hx     Social History Social History  Substance Use Topics  . Smoking status: Former Smoker    Packs/day: 1.50    Years: 15.00    Types: Cigarettes    Quit date: 08/12/1984  . Smokeless tobacco: Not on file  . Alcohol use Yes     Comment: 1-2 weekly     Allergies   Patient has no known allergies.   Review of Systems Review of Systems  Constitutional: Positive for appetite change and fever.  HENT: Negative for congestion.   Respiratory: Negative for cough and shortness of breath.   Gastrointestinal: Positive for abdominal  pain and nausea. Negative for blood in stool and diarrhea.  Genitourinary: Positive for frequency.  Musculoskeletal: Positive for arthralgias. Negative for back pain.  Neurological: Negative for weakness and numbness.  Hematological: Negative for adenopathy.  Psychiatric/Behavioral: Negative for confusion.     Physical Exam Updated Vital Signs BP 137/72   Pulse 90   Temp 100.7 F (38.2 C) (Oral)   Resp 18   Ht 5\' 10"  (1.778 m)   Wt 190 lb (86.2 kg)   SpO2 97%   BMI 27.26 kg/m   Physical Exam  Constitutional: He appears well-developed.  HENT:  Head: Atraumatic.  Neck: Neck  supple.  Cardiovascular: Normal rate.   Pulmonary/Chest: Effort normal.  Abdominal: Soft. There is tenderness.  Right-sided abdominal tenderness. Worse in the right lower quadrant without rebound or guarding.  Genitourinary:  Genitourinary Comments: Note testicular tenderness. No perineal tenderness.  Musculoskeletal: He exhibits no tenderness.  Neurological: He is alert.  Skin: Skin is warm. Capillary refill takes less than 2 seconds.     ED Treatments / Results  Labs (all labs ordered are listed, but only abnormal results are displayed) Labs Reviewed  COMPREHENSIVE METABOLIC PANEL - Abnormal; Notable for the following:       Result Value   Sodium 130 (*)    Chloride 99 (*)    CO2 21 (*)    Glucose, Bld 113 (*)    BUN 27 (*)    Creatinine, Ser 1.47 (*)    Calcium 8.7 (*)    Albumin 3.4 (*)    ALT 9 (*)    GFR calc non Af Amer 46 (*)    GFR calc Af Amer 54 (*)    All other components within normal limits  CBC - Abnormal; Notable for the following:    WBC 15.7 (*)    MCV 69.6 (*)    MCH 23.6 (*)    Platelets 148 (*)    All other components within normal limits  URINALYSIS, ROUTINE W REFLEX MICROSCOPIC - Abnormal; Notable for the following:    APPearance CLOUDY (*)    Hgb urine dipstick MODERATE (*)    Protein, ur 30 (*)    Leukocytes, UA LARGE (*)    Bacteria, UA RARE (*)    All other components within normal limits  URINE CULTURE  LIPASE, BLOOD    EKG  EKG Interpretation None       Radiology Ct Abdomen Pelvis W Contrast  Result Date: 01/25/2017 CLINICAL DATA:  Right lower quadrant pain x1 week with fever. EXAM: CT ABDOMEN AND PELVIS WITH CONTRAST TECHNIQUE: Multidetector CT imaging of the abdomen and pelvis was performed using the standard protocol following bolus administration of intravenous contrast. CONTRAST:  117mL ISOVUE-300 IOPAMIDOL (ISOVUE-300) INJECTION 61% COMPARISON:  None. FINDINGS: Lower chest: Tiny subpleural nodules in the right lower lobe,  measuring on the order of 2 mm to 4 mm, some of which are calcified and may reflect small granulomas. No pulmonary consolidation, effusion or pneumothorax. The visualized cardiac chambers are normal in size. Hepatobiliary: No focal liver abnormality is seen. No gallstones, gallbladder wall thickening, or biliary dilatation. Pancreas: Unremarkable. No pancreatic ductal dilatation or surrounding inflammatory changes. Spleen: Normal in size without focal abnormality. Adrenals/Urinary Tract: Normal bilateral adrenal glands. There appears be tiny calculi in the proximal right ureter in the region of the UPJ, the largest measuring approximately 5 x 3 mm causing right-sided perinephric fat stranding and slight delay in cortical enhancement relative to the left. Bilateral renal simple  cysts are noted, on the right, the largest is interpolar and partially parapelvic measuring 5.6 x 3.7 cm and on the left, also interpolar and parapelvic 3.7 x 2.5 cm. Stomach/Bowel: The appendix is not visualized. However no pericecal inflammation is identified or thickening noted. There is no bowel obstruction. There is colonic diverticulosis without acute diverticulitis. Vascular/Lymphatic: Aortic atherosclerosis. No enlarged abdominal or pelvic lymph nodes. Reproductive: Prostate is mildly enlarged up to 5 cm transverse by 3.6 cm AP and minimally impressing upon the base of the bladder. Other: Tiny fat containing umbilical hernia. No ascites or free air. Musculoskeletal: Degenerative disc disease L5-S1. No acute osseous abnormality. IMPRESSION: 1. Perinephric fat stranding on the right with asymmetric enhancement of the renal cortices, delayed slightly on the right secondary to what appear to be tiny proximal right ureteral stones, the largest approximately 5 x 3 mm. Superimposed urinary tract infection is not excluded. Correlate. 2. Simple appearing bilateral renal cysts. 3. The appendix is not visualized however no pericecal inflammation  is identified. No bowel obstruction is noted. 4. Tiny 4 mm or less right lower lobe pulmonary nodules some which are calcified and likely representing small granulomata. No follow-up needed if patient is low-risk (and has no known or suspected primary neoplasm). Non-contrast chest CT can be considered in 12 months if patient is high-risk. This recommendation follows the consensus statement: Guidelines for Management of Incidental Pulmonary Nodules Detected on CT Images: From the Fleischner Society 2017; Radiology 2017; 284:228-243. Electronically Signed   By: Ashley Royalty M.D.   On: 01/25/2017 20:52    Procedures Procedures (including critical care time)  Medications Ordered in ED Medications  sodium chloride 0.9 % bolus 1,000 mL (1,000 mLs Intravenous New Bag/Given 01/25/17 2048)  iopamidol (ISOVUE-300) 61 % injection (not administered)  cefTRIAXone (ROCEPHIN) 1 g in dextrose 5 % 50 mL IVPB (not administered)  acetaminophen (TYLENOL) tablet 1,000 mg (1,000 mg Oral Given 01/25/17 1441)  ondansetron (ZOFRAN) injection 4 mg (4 mg Intravenous Given 01/25/17 2041)  iopamidol (ISOVUE-300) 61 % injection 100 mL (100 mLs Intravenous Contrast Given 01/25/17 2024)  acetaminophen (TYLENOL) tablet 650 mg (650 mg Oral Given 01/25/17 2047)     Initial Impression / Assessment and Plan / ED Course  I have reviewed the triage vital signs and the nursing notes.  Pertinent labs & imaging results that were available during my care of the patient were reviewed by me and considered in my medical decision making (see chart for details).     Patient with abdominal pain and fever. White count is elevated. Urine shows likely infection. CT scan done and showed likely pyelonephritis and obstruction with a ureteral stone. Discussed with Dr. Jeffie Pollock from urology and patient will be transferred to [facility] long hospital for likely stenting. Also discussed with Dr. Tamera Punt in the ER.  Final Clinical Impressions(s) / ED Diagnoses    Final diagnoses:  Kidney stone  Acute pyelonephritis    New Prescriptions New Prescriptions   No medications on file     Davonna Belling, MD 01/25/17 2143

## 2017-01-25 NOTE — ED Triage Notes (Signed)
Pt reports right lower quad pain with fever for about a week.  Denies n/v/d.

## 2017-01-26 ENCOUNTER — Encounter (HOSPITAL_COMMUNITY): Payer: Self-pay | Admitting: Internal Medicine

## 2017-01-26 ENCOUNTER — Encounter (HOSPITAL_COMMUNITY)
Admission: EM | Disposition: A | Discharge status: Home / Self Care | Payer: Self-pay | Source: Home / Self Care | Attending: Internal Medicine

## 2017-01-26 ENCOUNTER — Inpatient Hospital Stay (HOSPITAL_COMMUNITY): Payer: Medicare Other | Admitting: Certified Registered Nurse Anesthetist

## 2017-01-26 DIAGNOSIS — D72829 Elevated white blood cell count, unspecified: Secondary | ICD-10-CM | POA: Diagnosis not present

## 2017-01-26 DIAGNOSIS — E785 Hyperlipidemia, unspecified: Secondary | ICD-10-CM | POA: Diagnosis present

## 2017-01-26 DIAGNOSIS — K219 Gastro-esophageal reflux disease without esophagitis: Secondary | ICD-10-CM | POA: Diagnosis not present

## 2017-01-26 DIAGNOSIS — Z87891 Personal history of nicotine dependence: Secondary | ICD-10-CM | POA: Diagnosis not present

## 2017-01-26 DIAGNOSIS — N201 Calculus of ureter: Secondary | ICD-10-CM | POA: Diagnosis present

## 2017-01-26 DIAGNOSIS — N10 Acute tubulo-interstitial nephritis: Secondary | ICD-10-CM | POA: Diagnosis not present

## 2017-01-26 DIAGNOSIS — Z791 Long term (current) use of non-steroidal anti-inflammatories (NSAID): Secondary | ICD-10-CM | POA: Diagnosis not present

## 2017-01-26 DIAGNOSIS — Z466 Encounter for fitting and adjustment of urinary device: Secondary | ICD-10-CM | POA: Diagnosis not present

## 2017-01-26 DIAGNOSIS — J302 Other seasonal allergic rhinitis: Secondary | ICD-10-CM | POA: Diagnosis present

## 2017-01-26 DIAGNOSIS — R109 Unspecified abdominal pain: Secondary | ICD-10-CM | POA: Diagnosis present

## 2017-01-26 DIAGNOSIS — N179 Acute kidney failure, unspecified: Secondary | ICD-10-CM | POA: Diagnosis present

## 2017-01-26 DIAGNOSIS — N401 Enlarged prostate with lower urinary tract symptoms: Secondary | ICD-10-CM | POA: Diagnosis not present

## 2017-01-26 DIAGNOSIS — A408 Other streptococcal sepsis: Secondary | ICD-10-CM | POA: Diagnosis not present

## 2017-01-26 DIAGNOSIS — N400 Enlarged prostate without lower urinary tract symptoms: Secondary | ICD-10-CM | POA: Diagnosis present

## 2017-01-26 DIAGNOSIS — N12 Tubulo-interstitial nephritis, not specified as acute or chronic: Secondary | ICD-10-CM | POA: Diagnosis present

## 2017-01-26 DIAGNOSIS — N202 Calculus of kidney with calculus of ureter: Secondary | ICD-10-CM | POA: Diagnosis not present

## 2017-01-26 DIAGNOSIS — K5900 Constipation, unspecified: Secondary | ICD-10-CM | POA: Diagnosis present

## 2017-01-26 DIAGNOSIS — R509 Fever, unspecified: Secondary | ICD-10-CM | POA: Diagnosis not present

## 2017-01-26 DIAGNOSIS — A419 Sepsis, unspecified organism: Secondary | ICD-10-CM | POA: Diagnosis not present

## 2017-01-26 DIAGNOSIS — N200 Calculus of kidney: Secondary | ICD-10-CM | POA: Diagnosis not present

## 2017-01-26 DIAGNOSIS — B952 Enterococcus as the cause of diseases classified elsewhere: Secondary | ICD-10-CM | POA: Diagnosis present

## 2017-01-26 HISTORY — PX: CYSTOSCOPY W/ URETERAL STENT PLACEMENT: SHX1429

## 2017-01-26 LAB — CBC
HCT: 32.5 % — ABNORMAL LOW (ref 39.0–52.0)
HEMOGLOBIN: 11.1 g/dL — AB (ref 13.0–17.0)
MCH: 23.5 pg — ABNORMAL LOW (ref 26.0–34.0)
MCHC: 34.2 g/dL (ref 30.0–36.0)
MCV: 68.7 fL — ABNORMAL LOW (ref 78.0–100.0)
PLATELETS: 215 10*3/uL (ref 150–400)
RBC: 4.73 MIL/uL (ref 4.22–5.81)
RDW: 14.8 % (ref 11.5–15.5)
WBC: 14.9 10*3/uL — AB (ref 4.0–10.5)

## 2017-01-26 LAB — BASIC METABOLIC PANEL
ANION GAP: 8 (ref 5–15)
BUN: 26 mg/dL — ABNORMAL HIGH (ref 6–20)
CALCIUM: 8.4 mg/dL — AB (ref 8.9–10.3)
CO2: 23 mmol/L (ref 22–32)
CREATININE: 1.3 mg/dL — AB (ref 0.61–1.24)
Chloride: 104 mmol/L (ref 101–111)
GFR, EST NON AFRICAN AMERICAN: 54 mL/min — AB (ref >60)
Glucose, Bld: 165 mg/dL — ABNORMAL HIGH (ref 65–99)
Potassium: 4.2 mmol/L (ref 3.5–5.1)
SODIUM: 135 mmol/L (ref 135–145)

## 2017-01-26 LAB — PROTIME-INR
INR: 1.19
Prothrombin Time: 15.2 seconds (ref 11.4–15.2)

## 2017-01-26 LAB — GLUCOSE, CAPILLARY: GLUCOSE-CAPILLARY: 189 mg/dL — AB (ref 65–99)

## 2017-01-26 LAB — LACTIC ACID, PLASMA: LACTIC ACID, VENOUS: 1.5 mmol/L (ref 0.5–1.9)

## 2017-01-26 LAB — APTT: APTT: 38 s — AB (ref 24–36)

## 2017-01-26 LAB — PROCALCITONIN: PROCALCITONIN: 0.91 ng/mL

## 2017-01-26 SURGERY — CYSTOSCOPY, WITH STENT INSERTION
Anesthesia: Choice | Laterality: Right

## 2017-01-26 SURGERY — CYSTOSCOPY, WITH RETROGRADE PYELOGRAM AND URETERAL STENT INSERTION
Anesthesia: General | Site: Ureter | Laterality: Right

## 2017-01-26 MED ORDER — LACTATED RINGERS IV SOLN
INTRAVENOUS | Status: DC | PRN
  Administered 2017-01-26: 02:00:00 via INTRAVENOUS

## 2017-01-26 MED ORDER — SODIUM CHLORIDE 0.9 % IV SOLN
INTRAVENOUS | Status: DC
  Administered 2017-01-26 – 2017-01-27 (×2): via INTRAVENOUS

## 2017-01-26 MED ORDER — SUCCINYLCHOLINE CHLORIDE 200 MG/10ML IV SOSY
PREFILLED_SYRINGE | INTRAVENOUS | Status: DC | PRN
  Administered 2017-01-26: 100 mg via INTRAVENOUS

## 2017-01-26 MED ORDER — DIPHENHYDRAMINE-APAP (SLEEP) 25-500 MG PO TABS: 1.0000 | ORAL_TABLET | Freq: Every evening | ORAL | Status: DC | PRN

## 2017-01-26 MED ORDER — MIDAZOLAM HCL 2 MG/2ML IJ SOLN
INTRAMUSCULAR | Status: AC
  Filled 2017-01-26: qty 2

## 2017-01-26 MED ORDER — SODIUM CHLORIDE 0.9% FLUSH
3.0000 mL | Freq: Two times a day (BID) | INTRAVENOUS | Status: DC
  Administered 2017-01-26 – 2017-01-27 (×3): 3 mL via INTRAVENOUS

## 2017-01-26 MED ORDER — SUCCINYLCHOLINE CHLORIDE 200 MG/10ML IV SOSY
PREFILLED_SYRINGE | INTRAVENOUS | Status: AC
  Filled 2017-01-26: qty 10

## 2017-01-26 MED ORDER — MIDAZOLAM HCL 5 MG/5ML IJ SOLN
INTRAMUSCULAR | Status: DC | PRN
  Administered 2017-01-26: 2 mg via INTRAVENOUS

## 2017-01-26 MED ORDER — DEXAMETHASONE SODIUM PHOSPHATE 10 MG/ML IJ SOLN
INTRAMUSCULAR | Status: DC | PRN
  Administered 2017-01-26: 10 mg via INTRAVENOUS

## 2017-01-26 MED ORDER — ROCURONIUM BROMIDE 50 MG/5ML IV SOSY
PREFILLED_SYRINGE | INTRAVENOUS | Status: DC | PRN
  Administered 2017-01-26: 30 mg via INTRAVENOUS

## 2017-01-26 MED ORDER — SIMETHICONE 40 MG/0.6ML PO SUSP
80.0000 mg | Freq: Four times a day (QID) | ORAL | Status: DC
  Filled 2017-01-26 (×2): qty 1.2

## 2017-01-26 MED ORDER — SIMVASTATIN 20 MG PO TABS
20.0000 mg | ORAL_TABLET | Freq: Every day | ORAL | Status: DC
  Administered 2017-01-26 – 2017-01-27 (×2): 20 mg via ORAL
  Filled 2017-01-26 (×2): qty 1

## 2017-01-26 MED ORDER — SUGAMMADEX SODIUM 200 MG/2ML IV SOLN
INTRAVENOUS | Status: DC | PRN
  Administered 2017-01-26: 300 mg via INTRAVENOUS

## 2017-01-26 MED ORDER — SODIUM CHLORIDE 0.9 % IV BOLUS (SEPSIS): 1500.0000 mL | Freq: Once | INTRAVENOUS | Status: DC

## 2017-01-26 MED ORDER — LIDOCAINE HCL 2 % EX GEL
CUTANEOUS | Status: DC | PRN
  Administered 2017-01-26: 1 via URETHRAL

## 2017-01-26 MED ORDER — DEXTROSE 5 % IV SOLN
1.0000 g | INTRAVENOUS | Status: DC
  Administered 2017-01-26: 1 g via INTRAVENOUS
  Filled 2017-01-26: qty 10

## 2017-01-26 MED ORDER — PANTOPRAZOLE SODIUM 40 MG PO TBEC
40.0000 mg | DELAYED_RELEASE_TABLET | Freq: Every day | ORAL | Status: DC
  Administered 2017-01-26 – 2017-01-28 (×3): 40 mg via ORAL
  Filled 2017-01-26 (×3): qty 1

## 2017-01-26 MED ORDER — FENTANYL CITRATE (PF) 100 MCG/2ML IJ SOLN
INTRAMUSCULAR | Status: AC
  Filled 2017-01-26: qty 2

## 2017-01-26 MED ORDER — ONDANSETRON HCL 4 MG/2ML IJ SOLN
INTRAMUSCULAR | Status: DC | PRN
  Administered 2017-01-26: 4 mg via INTRAVENOUS

## 2017-01-26 MED ORDER — ROCURONIUM BROMIDE 50 MG/5ML IV SOSY
PREFILLED_SYRINGE | INTRAVENOUS | Status: AC
  Filled 2017-01-26: qty 5

## 2017-01-26 MED ORDER — ZOLPIDEM TARTRATE 5 MG PO TABS
5.0000 mg | ORAL_TABLET | Freq: Every evening | ORAL | Status: DC | PRN
  Administered 2017-01-26: 5 mg via ORAL
  Filled 2017-01-26: qty 1

## 2017-01-26 MED ORDER — STERILE WATER FOR IRRIGATION IR SOLN
Status: DC | PRN
Start: 2017-01-26 — End: 2017-01-26
  Administered 2017-01-26: 1000 mL

## 2017-01-26 MED ORDER — DIPHENHYDRAMINE HCL 25 MG PO CAPS
25.0000 mg | ORAL_CAPSULE | Freq: Every evening | ORAL | Status: DC | PRN
  Administered 2017-01-27: 25 mg via ORAL
  Filled 2017-01-26: qty 1

## 2017-01-26 MED ORDER — LIDOCAINE 2% (20 MG/ML) 5 ML SYRINGE
INTRAMUSCULAR | Status: AC
  Filled 2017-01-26: qty 5

## 2017-01-26 MED ORDER — TAMSULOSIN HCL 0.4 MG PO CAPS
0.8000 mg | ORAL_CAPSULE | Freq: Every day | ORAL | Status: DC
  Administered 2017-01-26 – 2017-01-28 (×3): 0.8 mg via ORAL
  Filled 2017-01-26 (×3): qty 2

## 2017-01-26 MED ORDER — ACETAMINOPHEN 325 MG PO TABS
650.0000 mg | ORAL_TABLET | Freq: Four times a day (QID) | ORAL | Status: DC | PRN
  Administered 2017-01-26 – 2017-01-27 (×3): 650 mg via ORAL
  Filled 2017-01-26 (×3): qty 2

## 2017-01-26 MED ORDER — PROPOFOL 10 MG/ML IV BOLUS
INTRAVENOUS | Status: AC
  Filled 2017-01-26: qty 20

## 2017-01-26 MED ORDER — HYDROMORPHONE HCL 1 MG/ML IJ SOLN: 0.2500 mg | INTRAMUSCULAR | Status: DC | PRN

## 2017-01-26 MED ORDER — PROMETHAZINE HCL 25 MG/ML IJ SOLN: 6.2500 mg | INTRAMUSCULAR | Status: DC | PRN

## 2017-01-26 MED ORDER — ONDANSETRON HCL 4 MG/2ML IJ SOLN
INTRAMUSCULAR | Status: AC
  Filled 2017-01-26: qty 2

## 2017-01-26 MED ORDER — ONDANSETRON HCL 4 MG/2ML IJ SOLN: 4.0000 mg | Freq: Three times a day (TID) | INTRAMUSCULAR | Status: DC | PRN

## 2017-01-26 MED ORDER — ACETAMINOPHEN 650 MG RE SUPP: 650.0000 mg | Freq: Four times a day (QID) | RECTAL | Status: DC | PRN

## 2017-01-26 MED ORDER — DEXAMETHASONE SODIUM PHOSPHATE 10 MG/ML IJ SOLN
INTRAMUSCULAR | Status: AC
  Filled 2017-01-26: qty 1

## 2017-01-26 MED ORDER — FENTANYL CITRATE (PF) 100 MCG/2ML IJ SOLN
INTRAMUSCULAR | Status: DC | PRN
  Administered 2017-01-26 (×2): 50 ug via INTRAVENOUS

## 2017-01-26 MED ORDER — SODIUM CHLORIDE 0.9 % IV SOLN: INTRAVENOUS | Status: DC | PRN

## 2017-01-26 MED ORDER — PHENAZOPYRIDINE HCL 100 MG PO TABS
100.0000 mg | ORAL_TABLET | Freq: Three times a day (TID) | ORAL | Status: DC
  Administered 2017-01-26 – 2017-01-28 (×6): 100 mg via ORAL
  Filled 2017-01-26 (×9): qty 1

## 2017-01-26 MED ORDER — MORPHINE SULFATE (PF) 4 MG/ML IV SOLN
2.0000 mg | INTRAVENOUS | Status: DC | PRN
  Administered 2017-01-27: 2 mg via INTRAVENOUS
  Filled 2017-01-26: qty 1

## 2017-01-26 MED ORDER — MONTELUKAST SODIUM 10 MG PO TABS
10.0000 mg | ORAL_TABLET | Freq: Every day | ORAL | Status: DC
  Administered 2017-01-26 – 2017-01-27 (×2): 10 mg via ORAL
  Filled 2017-01-26 (×2): qty 1

## 2017-01-26 MED ORDER — LIDOCAINE HCL 2 % EX GEL
CUTANEOUS | Status: AC
  Filled 2017-01-26: qty 5

## 2017-01-26 MED ORDER — ACETAMINOPHEN 500 MG PO TABS
500.0000 mg | ORAL_TABLET | Freq: Every evening | ORAL | Status: DC | PRN
  Administered 2017-01-27: 500 mg via ORAL
  Filled 2017-01-26: qty 1

## 2017-01-26 MED ORDER — PROPOFOL 10 MG/ML IV BOLUS
INTRAVENOUS | Status: DC | PRN
  Administered 2017-01-26: 140 mg via INTRAVENOUS

## 2017-01-26 MED ORDER — IPRATROPIUM BROMIDE 0.03 % NA SOLN
1.0000 | Freq: Two times a day (BID) | NASAL | Status: DC
  Filled 2017-01-26: qty 30

## 2017-01-26 SURGICAL SUPPLY — 13 items
BAG URO CATCHER STRL LF (MISCELLANEOUS) ×3 IMPLANT
CATH COUDE 5CC RIBBED (CATHETERS) ×1 IMPLANT
CATH RIBBED COUDE 5CC (CATHETERS) ×2
CATH URET 5FR 28IN OPEN ENDED (CATHETERS) IMPLANT
CLOTH BEACON ORANGE TIMEOUT ST (SAFETY) ×3 IMPLANT
GLOVE SURG SS PI 8.0 STRL IVOR (GLOVE) IMPLANT
GOWN STRL REUS W/TWL XL LVL3 (GOWN DISPOSABLE) ×3 IMPLANT
GUIDEWIRE STR DUAL SENSOR (WIRE) ×3 IMPLANT
MANIFOLD NEPTUNE II (INSTRUMENTS) ×3 IMPLANT
PACK CYSTO (CUSTOM PROCEDURE TRAY) ×3 IMPLANT
STENT URET 6FRX26 CONTOUR (STENTS) ×3 IMPLANT
TUBING CONNECTING 10 (TUBING) ×2 IMPLANT
TUBING CONNECTING 10' (TUBING) ×1

## 2017-01-26 NOTE — Transfer of Care (Signed)
Immediate Anesthesia Transfer of Care Note  Patient: James Mccall  Procedure(s) Performed: Procedure(s): CYSTOSCOPY WITH RETROGRADE PYELOGRAM/URETERAL STENT PLACEMENT (Right)  Patient Location: PACU  Anesthesia Type:General  Level of Consciousness:  sedated, patient cooperative and responds to stimulation  Airway & Oxygen Therapy:Patient Spontanous Breathing and Patient connected to face mask oxgen  Post-op Assessment:  Report given to PACU RN and Post -op Vital signs reviewed and stable  Post vital signs:  Reviewed and stable  Last Vitals:  Vitals:   01/26/17 0010 01/26/17 0257  BP: 108/61   Pulse: 63 (P) 90  Resp: 19 (!) (P) 26  Temp: 36.9 C (P) [id number] C    Complications: No apparent anesthesia complications

## 2017-01-26 NOTE — H&P (Addendum)
History and Physical    [name] DOB: [birth year] DOA: 01/25/2017  Referring MD/NP/PA:   PCP: James Kilts, MD   Patient coming from:  The patient is coming from home.  At baseline, pt is independent for most of ADL.   Chief Complaint: Abdominal pain, fever and increased urinary frequency  HPI: James Mccall is a 71 y.o. male with medical history significant of hyperlipidemia, GERD, BPH, allergy, arthritis, who presents with abdominal pain, fever and increased urinary frequency.  Patient states that he has been having right lower abdominal pain for about 1 week. It is associated with fever and chills. His abdominal pain is constantly, 6 out of 10 in severity, nonradiating. It is not aggravated or alleviated by any known factors. He has increased urinary frequency and discomfort on urination recently, no hematuria. Patient does not have nausea and vomiting. Patient states that he has loose stool, which he attributes to taking laxatives currently. Patient denies flank pain, chest pain, SOB, cough, unilateral weakness.  ED Course: pt was found to have  WBC 15.7, positive urinalysis with large amount of leukocyte, lipase is 13, HDL with creatinine 1.47, temperature 102.7, no tachycardia, oxygen saturation normal on room air.   # CT abdomen//pelvis showed: tiny proximal right ureteral stones, the largest approximately 5 x 3 mm, and tiny 4 mm or less right lower lobe pulmonary nodules some of which are calcified and likely representing small granulomata. Simple appearing bilateral renal cysts.  Review of Systems:   General: has fevers, chills, no changes in body weight,  has fatigue HEENT: no blurry vision, hearing changes or sore throat Respiratory: no dyspnea, coughing, wheezing CV: no chest pain, no palpitations GI: no nausea, vomiting, has abdominal pain, and loose stool. No constipation GU: no dysuria, burning on urination, increased urinary frequency,  hematuria  Ext: no leg edema Neuro: no unilateral weakness, numbness, or tingling, no vision change or hearing loss Skin: no rash, no skin tear. MSK: No muscle spasm, no deformity, no limitation of range of movement in spin Heme: No easy bruising.  Travel history: No recent long distant travel.  Allergy: No Known Allergies  Past Medical History:  Diagnosis Date  . Arthritis   . Benign prostatic hyperplasia with urinary obstruction   . BPH (benign prostatic hyperplasia)   . GERD (gastroesophageal reflux disease)   . Hyperlipidemia   . Seasonal allergies     Past Surgical History:  Procedure Laterality Date  . CATARACT EXTRACTION W/PHACO Right 08/15/2015   Procedure: CATARACT EXTRACTION PHACO AND INTRAOCULAR LENS PLACEMENT (IOC);  Surgeon: Tonny Branch, MD;  Location: [facility];  Service: Ophthalmology;  Laterality: Right;  CDE:5.75  . CATARACT EXTRACTION W/PHACO Left 09/02/2015   Procedure: CATARACT EXTRACTION PHACO AND INTRAOCULAR LENS PLACEMENT LEFT EYE CDE=5.93;  Surgeon: Tonny Branch, MD;  Location: [facility];  Service: Ophthalmology;  Laterality: Left;  . COLONOSCOPY  06/30/04   [id number] hemorrhoids/pedunculated polyp in the rectum/left-sided and transverse diverticula  . COLONOSCOPY N/A 08/28/2013   Procedure: COLONOSCOPY;  Surgeon: Daneil Dolin, MD;  Location: [facility];  Service: Endoscopy;  Laterality: N/A;  9:45  . NASAL SINUS SURGERY    . shoulder sugery Bilateral    rotator cuff  . vocal cord polyps      Social History:  reports that he quit smoking about 32 years ago. His smoking use included Cigarettes. He has a 22.50 pack-year smoking history. He does not have any smokeless tobacco history on file. He reports  that he drinks alcohol. He reports that he does not use drugs.  Family History:  Family History  Problem Relation Age of Onset  . Colon cancer Neg Hx      Prior to Admission medications   Medication Sig Start Date End Date Taking? Authorizing Provider    diphenhydramine-acetaminophen (TYLENOL PM) 25-500 MG TABS Take 1-2 tablets by mouth at bedtime as needed (sleep).   Yes Historical Provider, MD  esomeprazole (NEXIUM) 40 MG capsule Take 40 mg by mouth daily.  01/09/17  Yes Historical Provider, MD  ipratropium (ATROVENT) 0.03 % nasal spray Place 1 spray into both nostrils 2 (two) times daily.  12/01/16  Yes Historical Provider, MD  meloxicam (MOBIC) 7.5 MG tablet Take 15 mg by mouth daily.  08/15/13  Yes Historical Provider, MD  montelukast (SINGULAIR) 10 MG tablet Take 10 mg by mouth at bedtime.  12/14/16  Yes Historical Provider, MD  simvastatin (ZOCOR) 20 MG tablet Take 20 mg by mouth daily.  08/15/13  Yes Historical Provider, MD  tamsulosin (FLOMAX) 0.4 MG CAPS capsule Take 0.8 mg by mouth daily.  08/15/13  Yes Historical Provider, MD    Physical Exam: Vitals:   01/25/17 2251 01/26/17 0006 01/26/17 0008 01/26/17 0010  BP: (!) 103/52 116/68  108/61  Pulse: 72 62  63  Resp: 18 18  19   Temp: 99.6 F (37.6 C) 99.5 F (37.5 C)  98.5 F (36.9 C)  TempSrc: Oral Oral  Oral  SpO2: 97% 97% 97% 99%  Weight:      Height:       General: Not in acute distress HEENT:       Eyes: PERRL, EOMI, no scleral icterus.       ENT: No discharge from the ears and nose, no pharynx injection, no tonsillar enlargement.        Neck: No JVD, no bruit, no mass felt. Heme: No neck lymph node enlargement. Cardiac: S1/S2, RRR, No murmurs, No gallops or rubs. Respiratory: No rales, wheezing, rhonchi or rubs. GI: Soft, nondistended, has tenderness in right abdomen, no rebound pain, no organomegaly, BS present. GU: No hematuria. Has positive R CVA tenderness. Ext: No pitting leg edema bilaterally. 2+DP/PT pulse bilaterally. Musculoskeletal: No joint deformities, No joint redness or warmth, no limitation of ROM in spin. Skin: No rashes.  Neuro: Alert, oriented X3, cranial nerves II-XII grossly intact, moves all extremities normally. Psych: Patient is not psychotic, no  suicidal or hemocidal ideation.  Labs on Admission: I have personally reviewed following labs and imaging studies  CBC:  Recent Labs Lab 01/25/17 1527  WBC 15.7*  HGB 13.3  HCT 39.2  MCV 69.6*  PLT 123456*   Basic Metabolic Panel:  Recent Labs Lab 01/25/17 1527  NA 130*  K 4.5  CL 99*  CO2 21*  GLUCOSE 113*  BUN 27*  CREATININE 1.47*  CALCIUM 8.7*   GFR: Estimated Creatinine Clearance: 47.6 mL/min (by C-G formula based on SCr of 1.47 mg/dL (H)). Liver Function Tests:  Recent Labs Lab 01/25/17 1527  AST 22  ALT 9*  ALKPHOS 65  BILITOT 1.2  PROT 7.7  ALBUMIN 3.4*    Recent Labs Lab 01/25/17 1527  LIPASE 13   No results for input(s): AMMONIA in the last 168 hours. Coagulation Profile: No results for input(s): INR, PROTIME in the last 168 hours. Cardiac Enzymes: No results for input(s): CKTOTAL, CKMB, CKMBINDEX, TROPONINI in the last 168 hours. BNP (last 3 results) No results for input(s): PROBNP in the  last 8760 hours. HbA1C: No results for input(s): HGBA1C in the last 72 hours. CBG: No results for input(s): GLUCAP in the last 168 hours. Lipid Profile: No results for input(s): CHOL, HDL, LDLCALC, TRIG, CHOLHDL, LDLDIRECT in the last 72 hours. Thyroid Function Tests: No results for input(s): TSH, T4TOTAL, FREET4, T3FREE, THYROIDAB in the last 72 hours. Anemia Panel: No results for input(s): VITAMINB12, FOLATE, FERRITIN, TIBC, IRON, RETICCTPCT in the last 72 hours. Urine analysis:    Component Value Date/Time   COLORURINE YELLOW 01/25/2017 2051   APPEARANCEUR CLOUDY (A) 01/25/2017 2051   LABSPEC 1.021 01/25/2017 2051   PHURINE 5.0 01/25/2017 2051   GLUCOSEU NEGATIVE 01/25/2017 2051   HGBUR MODERATE (A) 01/25/2017 2051   [place] NEGATIVE 01/25/2017 2051   [place] 01/25/2017 2051   PROTEINUR 30 (A) 01/25/2017 2051   NITRITE NEGATIVE 01/25/2017 2051   LEUKOCYTESUR LARGE (A) 01/25/2017 2051   Sepsis  Labs: @LABRCNTIP (procalcitonin:4,lacticidven:4) )No results found for this or any previous visit (from the past 240 hour(s)).   Radiological Exams on Admission: Ct Abdomen Pelvis W Contrast  Result Date: 01/25/2017 CLINICAL DATA:  Right lower quadrant pain x1 week with fever. EXAM: CT ABDOMEN AND PELVIS WITH CONTRAST TECHNIQUE: Multidetector CT imaging of the abdomen and pelvis was performed using the standard protocol following bolus administration of intravenous contrast. CONTRAST:  119mL ISOVUE-300 IOPAMIDOL (ISOVUE-300) INJECTION 61% COMPARISON:  None. FINDINGS: Lower chest: Tiny subpleural nodules in the right lower lobe, measuring on the order of 2 mm to 4 mm, some of which are calcified and may reflect small granulomas. No pulmonary consolidation, effusion or pneumothorax. The visualized cardiac chambers are normal in size. Hepatobiliary: No focal liver abnormality is seen. No gallstones, gallbladder wall thickening, or biliary dilatation. Pancreas: Unremarkable. No pancreatic ductal dilatation or surrounding inflammatory changes. Spleen: Normal in size without focal abnormality. Adrenals/Urinary Tract: Normal bilateral adrenal glands. There appears be tiny calculi in the proximal right ureter in the region of the UPJ, the largest measuring approximately 5 x 3 mm causing right-sided perinephric fat stranding and slight delay in cortical enhancement relative to the left. Bilateral renal simple cysts are noted, on the right, the largest is interpolar and partially parapelvic measuring 5.6 x 3.7 cm and on the left, also interpolar and parapelvic 3.7 x 2.5 cm. Stomach/Bowel: The appendix is not visualized. However no pericecal inflammation is identified or thickening noted. There is no bowel obstruction. There is colonic diverticulosis without acute diverticulitis. Vascular/Lymphatic: Aortic atherosclerosis. No enlarged abdominal or pelvic lymph nodes. Reproductive: Prostate is mildly enlarged up to 5 cm  transverse by 3.6 cm AP and minimally impressing upon the base of the bladder. Other: Tiny fat containing umbilical hernia. No ascites or free air. Musculoskeletal: Degenerative disc disease L5-S1. No acute osseous abnormality. IMPRESSION: 1. Perinephric fat stranding on the right with asymmetric enhancement of the renal cortices, delayed slightly on the right secondary to what appear to be tiny proximal right ureteral stones, the largest approximately 5 x 3 mm. Superimposed urinary tract infection is not excluded. Correlate. 2. Simple appearing bilateral renal cysts. 3. The appendix is not visualized however no pericecal inflammation is identified. No bowel obstruction is noted. 4. Tiny 4 mm or less right lower lobe pulmonary nodules some which are calcified and likely representing small granulomata. No follow-up needed if patient is low-risk (and has no known or suspected primary neoplasm). Non-contrast chest CT can be considered in 12 months if patient is high-risk. This recommendation follows the consensus statement: Guidelines for  Management of Incidental Pulmonary Nodules Detected on CT Images: From the Fleischner Society 2017; Radiology 2017; 284:228-243. Electronically Signed   By: Ashley Royalty M.D.   On: 01/25/2017 20:52     EKG: Not done in ED, will get one.   Assessment/Plan Principal Problem:   Pyelonephritis Active Problems:   Hyperlipidemia   BPH (benign prostatic hyperplasia)   GERD (gastroesophageal reflux disease)   Right ureteral stone   Sepsis ([place])   AKI (acute kidney injury) ([place])  Sepsis due to pyelonephritis in the setting of R ureteral stones: Patient meets criteria for sepsis with leukocytosis and fever. Currently hemodynamically stable. This is most likely due to pyelonephritis secondary to R ureteral stone. Urology, dr. Jeffie Pollock was consulted-->OR tonight.   - Admit to telemetry bed as inpt -  Ceftriaxone by IV - Follow up results of urine and blood cx and amend antibiotic  regimen if needed per sensitivity results - prn Zofran for nausea and morphine for pain - will get Procalcitonin and trend lactic acid levels per sepsis protocol. - IVF: 2.5L of NS bolus in ED, followed by 100 cc/h  - Appreciate urology's consultation, will follow up recommendations  AKI: Cre 1.47 Likely due to pyelonephritis and continuation of NSAIDs - IVF as above - Follow up renal function by BMP - Hold Mobic  BPH: stable - Continue Flomax  GERD: -Protonix  HLD: Last LDL was not on record -Continue home medications: zocor  Lung nodule: CT-abd/pelvis showed tiny 4 mm or less right lower lobe pulmonary nodules some of which are calcified and likely representing small granulomata.  -f/u with PCP  DVT ppx: SCD Code Status: Full code Family Communication: None at bed side.  Disposition Plan:  Anticipate discharge back to previous home environment Consults called:urorology, Dr.  Jeffie Pollock Admission status: Inpatient/tele     Date of Service 01/26/2017    Ivor Costa [facility] Pager [phone]  If 7PM-7AM, please contact night-coverage [email or web address] Password [email or web address] 01/26/2017, 2:15 AM

## 2017-01-26 NOTE — Consult Note (Addendum)
New Urology Consult Note   Requesting Attending Physician:  James Balls, MD Service Providing Consult: Urology Consulting Attending: Jeffie Pollock, MD  Assessment:  Patient is a 71 y.o. male with history of Oa, BPH, GERD, HLD presents with fevers of 102.7, WBC 15, infected urine with CT scan showing right sided perinephric stranding and delayed cortical enhancement with multiple small tiny stones (by report up to 5 x 64mm) at the right UPJ/proximal ureter.   Recommendations: 1. OR now with urology for cystoscopy, right ureteral stent placement. Risks/benefits of procedure reviewed with patient 2. Admit to medical service with urologic consultation 3. Continue ceftriaxone or broad spectrum antibiotics per medicine team 4. Will likely leave Foley catheter in OR 5. Follow up urine culture from ER and OR - tailor antibiotics accordingly 6. Any definitive stone management will be deferred 1-2 weeks until patient's infection has been adequately treated  Thank you for this consult. Please contact the urology consult pager with any further questions/concerns. Sharmaine Base, MD Urology Surgical Resident   Reason for Consult:  Right UPJ stones, infected urine  James Mccall is seen in consultation for reasons noted above at the request of James Balls, MD.  This is a 71 yo patient with a history of Oa, BPH followed by Dr. Junious Mccall at [place], GERD, [place] who presents to [facility] ER as transfer with 6-7 day history of fevers, diffuse abdominal pain, nausea with emesis x1. He has been taking ibuprofen & tylenol at home with continued cyclical fevers. He has also had some right back pain. He denies overt dysuria but has had some 'irritation' with voiding. No hematuria. Has baseline urinary frequency. Had some constipation and took miralax and then had some diarrhea over the last 6 days. Denies history of kidney stones. Sister has history of nephrolithiasis.   Tmax in ER 102.7. HR normal. BP 103/52 at [facility].  WBC 15.7. Cr 1.47. Urinalysis shows rare bact, large leuk, TNTC WBC/HPF. Received CTX x1 in Er. CT shows multiple small calculi in proximal right ureter/UPJ with right sided perinephric fat stranding and slight delay in cortical enhancement on the right.    Past Medical History: Past Medical History:  Diagnosis Date  . Arthritis   . BPH (benign prostatic hyperplasia)   . GERD (gastroesophageal reflux disease)   . Hyperlipidemia   . Seasonal allergies     Past Surgical History:  Past Surgical History:  Procedure Laterality Date  . CATARACT EXTRACTION W/PHACO Right 08/15/2015   Procedure: CATARACT EXTRACTION PHACO AND INTRAOCULAR LENS PLACEMENT (IOC);  Surgeon: Tonny Branch, MD;  Location: [facility];  Service: Ophthalmology;  Laterality: Right;  CDE:5.75  . CATARACT EXTRACTION W/PHACO Left 09/02/2015   Procedure: CATARACT EXTRACTION PHACO AND INTRAOCULAR LENS PLACEMENT LEFT EYE CDE=5.93;  Surgeon: Tonny Branch, MD;  Location: [facility];  Service: Ophthalmology;  Laterality: Left;  . COLONOSCOPY  06/30/04   [id number] hemorrhoids/pedunculated polyp in the rectum/left-sided and transverse diverticula  . COLONOSCOPY N/A 08/28/2013   Procedure: COLONOSCOPY;  Surgeon: James Dolin, MD;  Location: [facility];  Service: Endoscopy;  Laterality: N/A;  9:45  . NASAL SINUS SURGERY    . shoulder sugery Bilateral    rotator cuff  . vocal cord polyps      Medication: Current Facility-Administered Medications  Medication Dose Route Frequency Provider Last Rate Last Dose  . iopamidol (ISOVUE-300) 61 % injection            Current Outpatient Prescriptions  Medication Sig Dispense Refill  . diphenhydramine-acetaminophen (  TYLENOL PM) 25-500 MG TABS Take 1-2 tablets by mouth at bedtime as needed (sleep).    Marland Kitchen esomeprazole (NEXIUM) 40 MG capsule Take 40 mg by mouth daily.     Marland Kitchen ipratropium (ATROVENT) 0.03 % nasal spray Place 1 spray into both nostrils 2 (two) times daily.     . meloxicam (MOBIC) 7.5 MG  tablet Take 15 mg by mouth daily.     . montelukast (SINGULAIR) 10 MG tablet Take 10 mg by mouth at bedtime.     . simvastatin (ZOCOR) 20 MG tablet Take 20 mg by mouth daily.     . tamsulosin (FLOMAX) 0.4 MG CAPS capsule Take 0.8 mg by mouth daily.       Allergies: No Known Allergies  Social History: Social History  Substance Use Topics  . Smoking status: Former Smoker    Packs/day: 1.50    Years: 15.00    Types: Cigarettes    Quit date: 08/12/1984  . Smokeless tobacco: Not on file  . Alcohol use Yes     Comment: 1-2 weekly    Family History Family History  Problem Relation Age of Onset  . Colon cancer Neg Hx     Review of Systems 10 systems were reviewed and are negative except as noted specifically in the HPI.  Objective   Vital signs in last 24 hours: BP 108/61 (BP Location: Left Arm)   Pulse 63   Temp 98.5 F (36.9 C) (Oral)   Resp 19   Ht 5\' 10"  (1.778 m)   Wt 86.2 kg (190 lb)   SpO2 99%   BMI 27.26 kg/m   Intake/Output last 3 shifts: No intake/output data recorded.  Physical Exam General: NAD, A&O, resting, appropriate, warm to touch HEENT: [place]/AT, EOMI, MMM Pulmonary: Normal work of breathing on RA Cardiovascular: Regular rate & rhythm, HDS, adequate peripheral perfusion Abdomen: soft, mild TTP in right flank, nondistended, no suprapubic fullness or tenderness GU: no Foley present, + mild right CVA tenderness DRE: deferred Extremities: warm and well perfused, no edema Neuro: Appropriate, no focal neurological deficits  Most Recent Labs: Lab Results  Component Value Date   WBC 15.7 (H) 01/25/2017   HGB 13.3 01/25/2017   HCT 39.2 01/25/2017   PLT 148 (L) 01/25/2017    Lab Results  Component Value Date   NA 130 (L) 01/25/2017   K 4.5 01/25/2017   CL 99 (L) 01/25/2017   CO2 21 (L) 01/25/2017   BUN 27 (H) 01/25/2017   CREATININE 1.47 (H) 01/25/2017   CALCIUM 8.7 (L) 01/25/2017    Lab Results  Component Value Date   ALKPHOS 65 01/25/2017    BILITOT 1.2 01/25/2017   PROT 7.7 01/25/2017   ALBUMIN 3.4 (L) 01/25/2017   ALT 9 (L) 01/25/2017   AST 22 01/25/2017    No results found for: INR, APTT   Urine Culture: Pending   IMAGING: Ct Abdomen Pelvis W Contrast  Result Date: 01/25/2017 CLINICAL DATA:  Right lower quadrant pain x1 week with fever. EXAM: CT ABDOMEN AND PELVIS WITH CONTRAST TECHNIQUE: Multidetector CT imaging of the abdomen and pelvis was performed using the standard protocol following bolus administration of intravenous contrast. CONTRAST:  138mL ISOVUE-300 IOPAMIDOL (ISOVUE-300) INJECTION 61% COMPARISON:  None. FINDINGS: Lower chest: Tiny subpleural nodules in the right lower lobe, measuring on the order of 2 mm to 4 mm, some of which are calcified and may reflect small granulomas. No pulmonary consolidation, effusion or pneumothorax. The visualized cardiac chambers are normal in  size. Hepatobiliary: No focal liver abnormality is seen. No gallstones, gallbladder wall thickening, or biliary dilatation. Pancreas: Unremarkable. No pancreatic ductal dilatation or surrounding inflammatory changes. Spleen: Normal in size without focal abnormality. Adrenals/Urinary Tract: Normal bilateral adrenal glands. There appears be tiny calculi in the proximal right ureter in the region of the UPJ, the largest measuring approximately 5 x 3 mm causing right-sided perinephric fat stranding and slight delay in cortical enhancement relative to the left. Bilateral renal simple cysts are noted, on the right, the largest is interpolar and partially parapelvic measuring 5.6 x 3.7 cm and on the left, also interpolar and parapelvic 3.7 x 2.5 cm. Stomach/Bowel: The appendix is not visualized. However no pericecal inflammation is identified or thickening noted. There is no bowel obstruction. There is colonic diverticulosis without acute diverticulitis. Vascular/Lymphatic: Aortic atherosclerosis. No enlarged abdominal or pelvic lymph nodes. Reproductive:  Prostate is mildly enlarged up to 5 cm transverse by 3.6 cm AP and minimally impressing upon the base of the bladder. Other: Tiny fat containing umbilical hernia. No ascites or free air. Musculoskeletal: Degenerative disc disease L5-S1. No acute osseous abnormality. IMPRESSION: 1. Perinephric fat stranding on the right with asymmetric enhancement of the renal cortices, delayed slightly on the right secondary to what appear to be tiny proximal right ureteral stones, the largest approximately 5 x 3 mm. Superimposed urinary tract infection is not excluded. Correlate. 2. Simple appearing bilateral renal cysts. 3. The appendix is not visualized however no pericecal inflammation is identified. No bowel obstruction is noted. 4. Tiny 4 mm or less right lower lobe pulmonary nodules some which are calcified and likely representing small granulomata. No follow-up needed if patient is low-risk (and has no known or suspected primary neoplasm). Non-contrast chest CT can be considered in 12 months if patient is high-risk. This recommendation follows the consensus statement: Guidelines for Management of Incidental Pulmonary Nodules Detected on CT Images: From the Fleischner Society 2017; Radiology 2017; 284:228-243. Electronically Signed   By: Ashley Royalty M.D.   On: 01/25/2017 20:52    I have seen and examined Mr. Folse and reviewed his labs and CT.  We will proceed with cystoscopy and right stent insertion.   Risks reviewed.   CC: Dr. Electa Sniff

## 2017-01-26 NOTE — Anesthesia Preprocedure Evaluation (Signed)
Anesthesia Evaluation  Patient identified by MRN, date of birth, ID band Patient awake    Reviewed: Allergy & Precautions, NPO status , Patient's Chart, lab work & pertinent test results  Airway Mallampati: II  TM Distance: >3 FB Neck ROM: Full    Dental no notable dental hx.    Pulmonary neg pulmonary ROS, former smoker,    Pulmonary exam normal breath sounds clear to auscultation       Cardiovascular negative cardio ROS Normal cardiovascular exam Rhythm:Regular Rate:Normal     Neuro/Psych negative neurological ROS  negative psych ROS   GI/Hepatic negative GI ROS, Neg liver ROS,   Endo/Other  negative endocrine ROS  Renal/GU negative Renal ROS  negative genitourinary   Musculoskeletal negative musculoskeletal ROS (+)   Abdominal   Peds negative pediatric ROS (+)  Hematology negative hematology ROS (+)   Anesthesia Other Findings   Reproductive/Obstetrics negative OB ROS                             Anesthesia Physical Anesthesia Plan  ASA: II and emergent  Anesthesia Plan: General   Post-op Pain Management:    Induction: Intravenous and Rapid sequence  Airway Management Planned: Oral ETT  Additional Equipment:   Intra-op Plan:   Post-operative Plan: Extubation in OR  Informed Consent: I have reviewed the patients History and Physical, chart, labs and discussed the procedure including the risks, benefits and alternatives for the proposed anesthesia with the patient or authorized representative who has indicated his/her understanding and acceptance.   Dental advisory given  Plan Discussed with: CRNA and Surgeon  Anesthesia Plan Comments:         Anesthesia Quick Evaluation  

## 2017-01-26 NOTE — Op Note (Signed)
Preoperative Diagnosis: Right ureteral/UPJ calculi, infected urine  Postoperative Diagnosis:  Same  Procedure(s) Performed:  1. Cystourethroscopy 2. Right ureteral stent placement, 6Fr x 26cm JJ ureteral stent without dangler 3. Intraoperative fluoroscopy with interpretation <1hr.  Teaching Surgeon:  Irine Seal, MD  Resident Surgeon:  Sharmaine Base, MD  Assistant(s):  None  Anesthesia:  General  Fluids:  See anesthesia record  Estimated blood loss:  <5 mL  Specimens:  None  Cultures:  Right renal pelvis urine for culture  Drains:   6Fr x 26cm RIGHT ureteral stent without dangler 16Fr coude tip Foley catheter  Complications:  None  Indications: 71yo male with history of BPH presented to [facility] ER 01/25/17 and transferred to [facility] ER with infected urine, WBC 15.7, temp 102.7 with CT showing multiple small right ureteral stones in proximal ureter/UPJ with perinephric fat stranding. We have recommended decompressive right ureteral stent placement. Risks & benefits discussed with patient who wishes to proceed.   Findings:  No obvious intravesical tumor, foreign body, mucosal irregularity, stone noted. Moderate bilobar BPH and somewhat high bladder neck. No obvious radioopacity on fluoroscopy noted. Successful stent placement. Hemodynamically stable during entire procedure.   Description:  The patient was correctly identified in the preop holding area where written informed consent as well potential risk and complication reviewed. He agreed. They were brought back to the operative suite. Once correct information was verified, general anesthesia was induced. They were then gently placed into dorsal lithotomy position with SCDs in place for VTE prophylaxis. They were prepped and draped in the usual sterile fashion. He had received ceftriaxone in the ER. A timeout was then performed.   We inserted a 41F rigid cystoscope per urethra with copious lubrication and normal saline irrigation  running. This demonstrated findings as described above.    We turned our attention to the right ureteral orifice which we cannulated with the combination of a sensor wire and 42fr open ended catheter. The sensor wire was advanced into the right kidney under fluoroscopic guidance where an appropriate curl was noted in the kidney. The 25fr open ended catheter was advanced over the wire into the expected location of the right renal pelvis. No distinct radioopacities were noted in the right kidney or ureter. The sensor wire was removed. Hydronephrotic drip was collected from the 13fr open ended catheter and sent for urine culture. The sensor wire was then replaced and the 65fr open ended catheter was removed.   We then advanced a 6Fr x 26cm JJ ureteral stent without string over the sensor wire into the right kidney with the assistance of a stent pusher under direct visual & fluoroscopic guidance without difficulty. Sensor wire removal demonstrated satisfactory stent curl proximally in the an upper pole calyx of the right kidney and distally within the bladder. The bladder was left full and all instrumentation was removed. Lidocaine jelly was instilled in the urethra. A 16Fr coude tip Foley catheter was placed with 10cc in the catheter balloon. The patient was woken up from anesthesia and taken to the recovery unit for routine postop care.    Post Op Plan:   1. Admit to medicine team from PACU 2. Continue Foley catheter until patient on culture specific antibiotics, afebrile >24 consecutive hours and hemodynamically stable 3. Follow up urine cultures from ER & OR, tailor antibiotics accordingly 4. Flomax 0.4mg  qd (for BPH and stent discomfort), pyridium 100mg  TID PRN x 3 days for dysuria 5. Definitive stone surgery will be deferred for 1-2 weeks  Attestation:  Dr. Jeffie Pollock was present for the entire procedure.    Sharmaine Base, MD Resident, Department of Urology

## 2017-01-26 NOTE — Progress Notes (Addendum)
[facility]  Patient evaluated and chart reviewed.  He feels that pain in RLQ has resolved. No new complaints.   Principal Problem:   Pyelonephritis   Right ureteral stone   Sepsis with fever 102.7, leukocytosis   AKI (acute kidney injury) ([place]) - s/ p right stent - cont Rocephin - f/u U culture, WBC count - cont IVF  Active Problems:     BPH (benign prostatic hyperplasia) - has foley at this point - cont Flomax    GERD (gastroesophageal reflux disease) -cont PPI   Debbe Odea, MD

## 2017-01-26 NOTE — Anesthesia Procedure Notes (Signed)
Procedure Name: Intubation Date/Time: 01/26/2017 2:20 AM Performed by: West Pugh Pre-anesthesia Checklist: Patient identified, Emergency Drugs available, Suction available, Patient being monitored and Timeout performed Patient Re-evaluated:Patient Re-evaluated prior to inductionOxygen Delivery Method: Circle system utilized Preoxygenation: Pre-oxygenation with 100% oxygen Intubation Type: IV induction Ventilation: Mask ventilation without difficulty Laryngoscope Size: Mac and 4 Grade View: Grade III Tube type: Oral Number of attempts: 1 Airway Equipment and Method: Stylet Placement Confirmation: ETT inserted through vocal cords under direct vision,  positive ETCO2,  CO2 detector and breath sounds checked- equal and bilateral Secured at: 22 cm Tube secured with: Tape Dental Injury: Teeth and Oropharynx as per pre-operative assessment

## 2017-01-27 DIAGNOSIS — N401 Enlarged prostate with lower urinary tract symptoms: Secondary | ICD-10-CM

## 2017-01-27 DIAGNOSIS — A419 Sepsis, unspecified organism: Principal | ICD-10-CM

## 2017-01-27 DIAGNOSIS — N201 Calculus of ureter: Secondary | ICD-10-CM

## 2017-01-27 LAB — BASIC METABOLIC PANEL
Anion gap: 8 (ref 5–15)
BUN: 28 mg/dL — AB (ref 6–20)
CHLORIDE: 109 mmol/L (ref 101–111)
CO2: 23 mmol/L (ref 22–32)
CREATININE: 1.21 mg/dL (ref 0.61–1.24)
Calcium: 8.9 mg/dL (ref 8.9–10.3)
GFR calc Af Amer: >60 mL/min (ref >60)
GFR calc non Af Amer: 58 mL/min — ABNORMAL LOW (ref >60)
GLUCOSE: 125 mg/dL — AB (ref 65–99)
Potassium: 4.1 mmol/L (ref 3.5–5.1)
Sodium: 140 mmol/L (ref 135–145)

## 2017-01-27 LAB — CBC
HEMATOCRIT: 33.5 % — AB (ref 39.0–52.0)
Hemoglobin: 11.8 g/dL — ABNORMAL LOW (ref 13.0–17.0)
MCH: 25.8 pg — AB (ref 26.0–34.0)
MCHC: 35.2 g/dL (ref 30.0–36.0)
MCV: 73.3 fL — ABNORMAL LOW (ref 78.0–100.0)
PLATELETS: 275 10*3/uL (ref 150–400)
RBC: 4.57 MIL/uL (ref 4.22–5.81)
RDW: 15.3 % (ref 11.5–15.5)
WBC: 17.5 10*3/uL — ABNORMAL HIGH (ref 4.0–10.5)

## 2017-01-27 LAB — GLUCOSE, CAPILLARY: Glucose-Capillary: 119 mg/dL — ABNORMAL HIGH (ref 65–99)

## 2017-01-27 MED ORDER — PHENAZOPYRIDINE HCL 100 MG PO TABS: 100.0000 mg | ORAL_TABLET | Freq: Three times a day (TID) | ORAL | 0 refills | Status: DC | PRN

## 2017-01-27 MED ORDER — ACETAMINOPHEN 325 MG PO TABS: 650.0000 mg | ORAL_TABLET | Freq: Four times a day (QID) | ORAL | Status: DC | PRN

## 2017-01-27 MED ORDER — SODIUM CHLORIDE 0.9 % IV SOLN
1.0000 g | Freq: Four times a day (QID) | INTRAVENOUS | Status: DC
  Administered 2017-01-27 – 2017-01-28 (×4): 1 g via INTRAVENOUS
  Filled 2017-01-27 (×5): qty 1000

## 2017-01-27 MED ORDER — SIMETHICONE 80 MG PO CHEW
160.0000 mg | CHEWABLE_TABLET | Freq: Four times a day (QID) | ORAL | Status: DC | PRN
  Administered 2017-01-27: 160 mg via ORAL
  Filled 2017-01-27: qty 2

## 2017-01-27 MED ORDER — CEFPODOXIME PROXETIL 200 MG PO TABS: 200.0000 mg | ORAL_TABLET | Freq: Two times a day (BID) | ORAL | 0 refills | Status: DC

## 2017-01-27 NOTE — Anesthesia Postprocedure Evaluation (Addendum)
Anesthesia Post Note  Patient: VORIS TIGERT  Procedure(s) Performed: Procedure(s) (LRB): CYSTOSCOPY WITH RETROGRADE PYELOGRAM/URETERAL STENT PLACEMENT (Right)  Patient location during evaluation: PACU Anesthesia Type: General Level of consciousness: awake and alert Pain management: pain level controlled Vital Signs Assessment: post-procedure vital signs reviewed and stable Respiratory status: spontaneous breathing, nonlabored ventilation, respiratory function stable and patient connected to nasal cannula oxygen Cardiovascular status: blood pressure returned to baseline and stable Postop Assessment: no signs of nausea or vomiting Anesthetic complications: no       Last Vitals:  Vitals:   01/26/17 2132 01/27/17 0554  BP: 132/74 121/77  Pulse: 61 (!) 57  Resp: 20 20  Temp: 37.3 C 37.2 C    Last Pain:  Vitals:   01/27/17 0554  TempSrc: Oral  PainSc:                  Noble Bodie S

## 2017-01-27 NOTE — Progress Notes (Signed)
PROGRESS NOTE    PHILIPP CALLEGARI   XTK:[id number]  DOB: [birth year]  DOA: 01/25/2017 PCP: Purvis Kilts, MD   Brief Narrative:  James Mccall is a 71 y.o. male with medical history significant of hyperlipidemia, GERD, BPH, allergy, arthritis, who presents with abdominal pain, fever and increased urinary frequency.  Patient states that he has been having right lower abdominal pain for about 1 week. It is associated with fever and chills. His abdominal pain is constantly, 6 out of 10 in severity, nonradiating. It is not aggravated or alleviated by any known factors. He has increased urinary frequency and discomfort on urination recently, no hematuria. Patient does not have nausea and vomiting. Patient states that he has loose stool, which he attributes to taking laxatives currently. Patient denies flank pain, chest pain, SOB, cough, unilateral weakness. CT suggestive of R pyelonephritis and tiny proximal ureteral stones.  Underwent stent of R ureter on 3/6 after admission.   Subjective: Improved R flank pain. No other symptoms  Assessment & Plan:   Pyelonephritis   Right ureteral stones   Sepsis with fever 102.7, leukocytosis   AKI (acute kidney injury) ([place]) - s/ p right stent - f/u U culture growing 10,000 enterococcus- will change Rocephin to Ampicillin- f/u sensitivities- fever resolved, WBC count elevated today at 17 (14.9 yesterday) - Cr normalized- drinking fluids well- stop IVF  Active Problems:     BPH (benign prostatic hyperplasia) -  oley placed in relation to above procedure to be removed today - cont Flomax    GERD (gastroesophageal reflux disease) -cont PPI  Pulmonary nodules- 4 mm or less - see imaging report below Non-contrast chest CT can be considered in 12 months if patient is high-risk.    DVT prophylaxis: Lovenox Code Status: full code Family Communication:  Disposition Plan: home tomorrow Consultants:   Urology Procedures:   Stent R  ureter Antimicrobials:  Anti-infectives    Start     Dose/Rate Route Frequency Ordered Stop   01/27/17 1100  ampicillin (OMNIPEN) 1 g in sodium chloride 0.9 % 50 mL IVPB     1 g 150 mL/hr over 20 Minutes Intravenous Every 6 hours 01/27/17 1030     01/27/17 0000  cefpodoxime (VANTIN) 200 MG tablet     200 mg Oral 2 times daily 01/27/17 1018     01/26/17 2100  cefTRIAXone (ROCEPHIN) 1 g in dextrose 5 % 50 mL IVPB  Status:  Discontinued     1 g 100 mL/hr over 30 Minutes Intravenous Every 24 hours 01/26/17 0129 01/27/17 1030   01/25/17 2130  cefTRIAXone (ROCEPHIN) 1 g in dextrose 5 % 50 mL IVPB     1 g 100 mL/hr over 30 Minutes Intravenous  Once 01/25/17 2126 01/25/17 2243       Objective: Vitals:   01/26/17 0942 01/26/17 1357 01/26/17 2132 01/27/17 0554  BP:  125/72 132/74 121/77  Pulse:  68 61 (!) 57  Resp:  19 20 20   Temp:  98.7 F (37.1 C) 99.1 F (37.3 C) 98.9 F (37.2 C)  TempSrc:  Oral Oral Oral  SpO2: 100% 99% 98% 100%  Weight:      Height:        Intake/Output Summary (Last 24 hours) at 01/27/17 1332 Last data filed at 01/27/17 0600  Gross per 24 hour  Intake          1248.33 ml  Output             2550  ml  Net         -1301.67 ml   Filed Weights   01/25/17 1435 01/26/17 0523  Weight: 86.2 kg (190 lb) 89.9 kg (198 lb 3.1 oz)    Examination: General exam: Appears comfortable  HEENT: PERRLA, oral mucosa moist, no sclera icterus or thrush Respiratory system: Clear to auscultation. Respiratory effort normal. Cardiovascular system: S1 & S2 heard, RRR.  No murmurs  Gastrointestinal system: Abdomen soft, non-tender, nondistended. Normal bowel sound. No organomegaly Central nervous system: Alert and oriented. No focal neurological deficits. Extremities: No cyanosis, clubbing or edema Skin: No rashes or ulcers Psychiatry:  Mood & affect appropriate.     Data Reviewed: I have personally reviewed following labs and imaging studies  CBC:  Recent Labs Lab  01/25/17 1527 01/26/17 0650 01/27/17 0721  WBC 15.7* 14.9* 17.5*  HGB 13.3 11.1* 11.8*  HCT 39.2 32.5* 33.5*  MCV 69.6* 68.7* 73.3*  PLT 148* 215 169   Basic Metabolic Panel:  Recent Labs Lab 01/25/17 1527 01/26/17 0650 01/27/17 0721  NA 130* 135 140  K 4.5 4.2 4.1  CL 99* 104 109  CO2 21* 23 23  GLUCOSE 113* 165* 125*  BUN 27* 26* 28*  CREATININE 1.47* 1.30* 1.21  CALCIUM 8.7* 8.4* 8.9   GFR: Estimated Creatinine Clearance: 61 mL/min (by C-G formula based on SCr of 1.21 mg/dL). Liver Function Tests:  Recent Labs Lab 01/25/17 1527  AST 22  ALT 9*  ALKPHOS 65  BILITOT 1.2  PROT 7.7  ALBUMIN 3.4*    Recent Labs Lab 01/25/17 1527  LIPASE 13   No results for input(s): AMMONIA in the last 168 hours. Coagulation Profile:  Recent Labs Lab 01/26/17 0650  INR 1.19   Cardiac Enzymes: No results for input(s): CKTOTAL, CKMB, CKMBINDEX, TROPONINI in the last 168 hours. BNP (last 3 results) No results for input(s): PROBNP in the last 8760 hours. HbA1C: No results for input(s): HGBA1C in the last 72 hours. CBG:  Recent Labs Lab 01/26/17 0727 01/27/17 0805  GLUCAP 189* 119*   Lipid Profile: No results for input(s): CHOL, HDL, LDLCALC, TRIG, CHOLHDL, LDLDIRECT in the last 72 hours. Thyroid Function Tests: No results for input(s): TSH, T4TOTAL, FREET4, T3FREE, THYROIDAB in the last 72 hours. Anemia Panel: No results for input(s): VITAMINB12, FOLATE, FERRITIN, TIBC, IRON, RETICCTPCT in the last 72 hours. Urine analysis:    Component Value Date/Time   COLORURINE YELLOW 01/25/2017 2051   APPEARANCEUR CLOUDY (A) 01/25/2017 2051   LABSPEC 1.021 01/25/2017 2051   PHURINE 5.0 01/25/2017 2051   GLUCOSEU NEGATIVE 01/25/2017 2051   HGBUR MODERATE (A) 01/25/2017 2051   [place] NEGATIVE 01/25/2017 2051   [place] 01/25/2017 2051   PROTEINUR 30 (A) 01/25/2017 2051   NITRITE NEGATIVE 01/25/2017 2051   LEUKOCYTESUR LARGE (A) 01/25/2017 2051    Sepsis Labs: @LABRCNTIP (procalcitonin:4,lacticidven:4) ) Recent Results (from the past 240 hour(s))  Urine culture     Status: None (Preliminary result)   Collection Time: 01/25/17  9:26 PM  Result Value Ref Range Status   Specimen Description URINE, CLEAN CATCH  Final   Special Requests NONE  Final   Culture   Final    CULTURE REINCUBATED FOR BETTER GROWTH Performed at [facility], [place] [address] [place], [place] [address]    Report Status PENDING  Incomplete  Urine culture     Status: Abnormal (Preliminary result)   Collection Time: 01/26/17  2:30 AM  Result Value Ref Range Status  Specimen Description URINE, CATHETERIZED CYSTOSCOPE  Final   Special Requests NONE  Final   Culture (A)  Final    10,000 COLONIES/mL ENTEROCOCCUS SPECIES SUSCEPTIBILITIES TO FOLLOW Performed at [facility], [address], [place], [place] [address]    Report Status PENDING  Incomplete  Culture, blood (x 2)     Status: None (Preliminary result)   Collection Time: 01/26/17  6:52 AM  Result Value Ref Range Status   Specimen Description BLOOD LEFT ARM  Final   Special Requests BOTTLES DRAWN AEROBIC AND ANAEROBIC 10CC  Final   Culture   Final    NO GROWTH 1 DAY Performed at [facility], [place] [address] [place], [place] [address]    Report Status PENDING  Incomplete  Culture, blood (x 2)     Status: None (Preliminary result)   Collection Time: 01/26/17  6:53 AM  Result Value Ref Range Status   Specimen Description BLOOD LEFT HAND  Final   Special Requests BOTTLES DRAWN AEROBIC AND ANAEROBIC 5CC  Final   Culture   Final    NO GROWTH 1 DAY Performed at [facility], [place] [address], [place], [place] [address]    Report Status PENDING  Incomplete         Radiology Studies: Ct Abdomen Pelvis W Contrast  Result Date: 01/25/2017 CLINICAL DATA:  Right lower quadrant pain x1 week with fever. EXAM: CT ABDOMEN AND PELVIS WITH CONTRAST TECHNIQUE: Multidetector CT  imaging of the abdomen and pelvis was performed using the standard protocol following bolus administration of intravenous contrast. CONTRAST:  130mL ISOVUE-300 IOPAMIDOL (ISOVUE-300) INJECTION 61% COMPARISON:  None. FINDINGS: Lower chest: Tiny subpleural nodules in the right lower lobe, measuring on the order of 2 mm to 4 mm, some of which are calcified and may reflect small granulomas. No pulmonary consolidation, effusion or pneumothorax. The visualized cardiac chambers are normal in size. Hepatobiliary: No focal liver abnormality is seen. No gallstones, gallbladder wall thickening, or biliary dilatation. Pancreas: Unremarkable. No pancreatic ductal dilatation or surrounding inflammatory changes. Spleen: Normal in size without focal abnormality. Adrenals/Urinary Tract: Normal bilateral adrenal glands. There appears be tiny calculi in the proximal right ureter in the region of the UPJ, the largest measuring approximately 5 x 3 mm causing right-sided perinephric fat stranding and slight delay in cortical enhancement relative to the left. Bilateral renal simple cysts are noted, on the right, the largest is interpolar and partially parapelvic measuring 5.6 x 3.7 cm and on the left, also interpolar and parapelvic 3.7 x 2.5 cm. Stomach/Bowel: The appendix is not visualized. However no pericecal inflammation is identified or thickening noted. There is no bowel obstruction. There is colonic diverticulosis without acute diverticulitis. Vascular/Lymphatic: Aortic atherosclerosis. No enlarged abdominal or pelvic lymph nodes. Reproductive: Prostate is mildly enlarged up to 5 cm transverse by 3.6 cm AP and minimally impressing upon the base of the bladder. Other: Tiny fat containing umbilical hernia. No ascites or free air. Musculoskeletal: Degenerative disc disease L5-S1. No acute osseous abnormality. IMPRESSION: 1. Perinephric fat stranding on the right with asymmetric enhancement of the renal cortices, delayed slightly on  the right secondary to what appear to be tiny proximal right ureteral stones, the largest approximately 5 x 3 mm. Superimposed urinary tract infection is not excluded. Correlate. 2. Simple appearing bilateral renal cysts. 3. The appendix is not visualized however no pericecal inflammation is identified. No bowel obstruction is noted. 4. Tiny 4 mm or less right lower lobe pulmonary  nodules some which are calcified and likely representing small granulomata. No follow-up needed if patient is low-risk (and has no known or suspected primary neoplasm). Non-contrast chest CT can be considered in 12 months if patient is high-risk. This recommendation follows the consensus statement: Guidelines for Management of Incidental Pulmonary Nodules Detected on CT Images: From the Fleischner Society 2017; Radiology 2017; 284:228-243. Electronically Signed   By: Ashley Royalty M.D.   On: 01/25/2017 20:52      Scheduled Meds: . ampicillin (OMNIPEN) IV  1 g Intravenous Q6H  . ipratropium  1 spray Each Nare BID  . montelukast  10 mg Oral QHS  . pantoprazole  40 mg Oral Daily  . phenazopyridine  100 mg Oral TID WC  . simvastatin  20 mg Oral Daily  . sodium chloride  1,500 mL Intravenous Once  . sodium chloride flush  3 mL Intravenous Q12H  . tamsulosin  0.8 mg Oral Daily   Continuous Infusions:   LOS: 1 day    Time spent in minutes: 64    [place], MD [facility] Pager: [email or web address] Password [email or web address] 01/27/2017, 1:32 PM

## 2017-01-27 NOTE — Progress Notes (Signed)
1 Day Post-Op   Assessment/Plan: Principal Problem:   Pyelonephritis   Right ureteral stone   Sepsis with fever 102.7, leukocytosis   AKI (acute kidney injury) ([place]) - s/ p right stent - urine Cx pending - Cx growing 10K enterococcus-I tailor abx toward that. Maybe Augmentin or Levaquin would be good options. Sensitivities are pending.  -d/c foley  -pt will need Right ureteroscopy in a few weeks - I will arrange.    Subjective: Patient reports no complaints. Feels better.   Objective: Vital signs in last 24 hours: Temp:  [98.7 F (37.1 C)-99.1 F (37.3 C)] 98.9 F (37.2 C) (03/07 0554) Pulse Rate:  [57-68] 57 (03/07 0554) Resp:  [18-20] 20 (03/07 0554) BP: (113-132)/(72-77) 121/77 (03/07 0554) SpO2:  [98 %-100 %] 100 % (03/07 0554)  Intake/Output from previous day: 03/06 0701 - 03/07 0700 In: 1488.3 [P.O.:480; I.V.:958.3; IV Piggyback:50] Out: 3100 [Urine:3100] Intake/Output this shift: No intake/output data recorded.  Physical Exam:   NAD In bed eating breakfast.  Urine clear   Lab Results:  Recent Labs  01/25/17 1527 01/26/17 0650  HGB 13.3 11.1*  HCT 39.2 32.5*   BMET  Recent Labs  01/25/17 1527 01/26/17 0650  NA 130* 135  K 4.5 4.2  CL 99* 104  CO2 21* 23  GLUCOSE 113* 165*  BUN 27* 26*  CREATININE 1.47* 1.30*  CALCIUM 8.7* 8.4*    Recent Labs  01/26/17 0650  INR 1.19   No results for input(s): LABURIN in the last 72 hours. No results found for this or any previous visit.  Studies/Results: Ct Abdomen Pelvis W Contrast  Result Date: 01/25/2017 CLINICAL DATA:  Right lower quadrant pain x1 week with fever. EXAM: CT ABDOMEN AND PELVIS WITH CONTRAST TECHNIQUE: Multidetector CT imaging of the abdomen and pelvis was performed using the standard protocol following bolus administration of intravenous contrast. CONTRAST:  166mL ISOVUE-300 IOPAMIDOL (ISOVUE-300) INJECTION 61% COMPARISON:  None. FINDINGS: Lower chest: Tiny subpleural nodules in the  right lower lobe, measuring on the order of 2 mm to 4 mm, some of which are calcified and may reflect small granulomas. No pulmonary consolidation, effusion or pneumothorax. The visualized cardiac chambers are normal in size. Hepatobiliary: No focal liver abnormality is seen. No gallstones, gallbladder wall thickening, or biliary dilatation. Pancreas: Unremarkable. No pancreatic ductal dilatation or surrounding inflammatory changes. Spleen: Normal in size without focal abnormality. Adrenals/Urinary Tract: Normal bilateral adrenal glands. There appears be tiny calculi in the proximal right ureter in the region of the UPJ, the largest measuring approximately 5 x 3 mm causing right-sided perinephric fat stranding and slight delay in cortical enhancement relative to the left. Bilateral renal simple cysts are noted, on the right, the largest is interpolar and partially parapelvic measuring 5.6 x 3.7 cm and on the left, also interpolar and parapelvic 3.7 x 2.5 cm. Stomach/Bowel: The appendix is not visualized. However no pericecal inflammation is identified or thickening noted. There is no bowel obstruction. There is colonic diverticulosis without acute diverticulitis. Vascular/Lymphatic: Aortic atherosclerosis. No enlarged abdominal or pelvic lymph nodes. Reproductive: Prostate is mildly enlarged up to 5 cm transverse by 3.6 cm AP and minimally impressing upon the base of the bladder. Other: Tiny fat containing umbilical hernia. No ascites or free air. Musculoskeletal: Degenerative disc disease L5-S1. No acute osseous abnormality. IMPRESSION: 1. Perinephric fat stranding on the right with asymmetric enhancement of the renal cortices, delayed slightly on the right secondary to what appear to be tiny proximal right ureteral stones, the  largest approximately 5 x 3 mm. Superimposed urinary tract infection is not excluded. Correlate. 2. Simple appearing bilateral renal cysts. 3. The appendix is not visualized however no  pericecal inflammation is identified. No bowel obstruction is noted. 4. Tiny 4 mm or less right lower lobe pulmonary nodules some which are calcified and likely representing small granulomata. No follow-up needed if patient is low-risk (and has no known or suspected primary neoplasm). Non-contrast chest CT can be considered in 12 months if patient is high-risk. This recommendation follows the consensus statement: Guidelines for Management of Incidental Pulmonary Nodules Detected on CT Images: From the Fleischner Society 2017; Radiology 2017; 284:228-243. Electronically Signed   By: Ashley Royalty M.D.   On: 01/25/2017 20:52      LOS: 1 day   Merwyn Hodapp 01/27/2017, 7:35 AM

## 2017-01-28 DIAGNOSIS — N10 Acute tubulo-interstitial nephritis: Secondary | ICD-10-CM

## 2017-01-28 DIAGNOSIS — N200 Calculus of kidney: Secondary | ICD-10-CM

## 2017-01-28 DIAGNOSIS — A408 Other streptococcal sepsis: Secondary | ICD-10-CM

## 2017-01-28 DIAGNOSIS — E785 Hyperlipidemia, unspecified: Secondary | ICD-10-CM

## 2017-01-28 LAB — URINE CULTURE

## 2017-01-28 LAB — BASIC METABOLIC PANEL
Anion gap: 8 (ref 5–15)
BUN: 19 mg/dL (ref 6–20)
CALCIUM: 8.9 mg/dL (ref 8.9–10.3)
CO2: 24 mmol/L (ref 22–32)
CREATININE: 1.2 mg/dL (ref 0.61–1.24)
Chloride: 108 mmol/L (ref 101–111)
GFR calc non Af Amer: 59 mL/min — ABNORMAL LOW (ref >60)
Glucose, Bld: 109 mg/dL — ABNORMAL HIGH (ref 65–99)
Potassium: 4.2 mmol/L (ref 3.5–5.1)
SODIUM: 140 mmol/L (ref 135–145)

## 2017-01-28 LAB — CBC
HCT: 34.9 % — ABNORMAL LOW (ref 39.0–52.0)
Hemoglobin: 11.8 g/dL — ABNORMAL LOW (ref 13.0–17.0)
MCH: 23.1 pg — AB (ref 26.0–34.0)
MCHC: 33.8 g/dL (ref 30.0–36.0)
MCV: 68.3 fL — ABNORMAL LOW (ref 78.0–100.0)
PLATELETS: 250 10*3/uL (ref 150–400)
RBC: 5.11 MIL/uL (ref 4.22–5.81)
RDW: 14.7 % (ref 11.5–15.5)
WBC: 14 10*3/uL — AB (ref 4.0–10.5)

## 2017-01-28 LAB — GLUCOSE, CAPILLARY: GLUCOSE-CAPILLARY: 124 mg/dL — AB (ref 65–99)

## 2017-01-28 MED ORDER — AMOXICILLIN 875 MG PO TABS: 875.0000 mg | ORAL_TABLET | Freq: Two times a day (BID) | ORAL | 0 refills | Status: DC

## 2017-01-31 LAB — CULTURE, BLOOD (ROUTINE X 2)
Culture: NO GROWTH
Culture: NO GROWTH

## 2017-02-02 ENCOUNTER — Other Ambulatory Visit: Payer: Self-pay | Admitting: Urology

## 2017-02-03 NOTE — Discharge Summary (Signed)
Physician Discharge Summary  James Mccall TIR:[id number] DOB: 1946/02/18 DOA: 01/25/2017  PCP: Purvis Kilts, MD  Admit date: 01/25/2017 Discharge date: 02/03/2017  Admitted From: home Disposition:  Home   Recommendations for Outpatient Follow-up:  1. F/u with Urology  Discharge Condition:  stable   CODE STATUS:  Full code   Diet recommendation:  Heart healthy Consultations:  Urology    Discharge Diagnoses:  Principal Problem:   Pyelonephritis Active Problems:   Right ureteral stone   AKI (acute kidney injury) ([place])   Hyperlipidemia   BPH (benign prostatic hyperplasia)   GERD (gastroesophageal reflux disease)   Sepsis ([place])    Pilar Plate E Rochelleis a 71 y.o.malewith medical history significant of hyperlipidemia, GERD, BPH, allergy, arthritis, who presents with abdominal pain, fever and increased urinary frequency.  Patient states that he has been having right lower abdominal pain for about 1 week. It is associated with fever and chills. His abdominal pain is constantly, 6 out of 10 in severity, nonradiating. It is not aggravated or alleviated by any known factors. He has increased urinary frequency and discomfort on urination recently, no hematuria. Patient does not have nausea and vomiting. Patient states that he has loose stool, which he attributes to taking laxatives currently. Patient denies flank pain, chest pain, SOB,cough, unilateral weakness. CT suggestive of R pyelonephritis and tiny proximal ureteral stones.  Underwent stent of R ureter on 3/6 after admission.   Subjective: No symptoms of flank pain, dysuria or GI symptoms from antibiotics.   Assessment & Plan:   Pyelonephritis Right ureteral stones Sepsis with fever 102.7, leukocytosis AKI (acute kidney injury) ([place]) - s/ p right stent - f/u U culture growing 10,000 enterococcus- will change Rocephin to Ampicillin to which it is sensitive - fever resolved - Cr normalized- drinking fluids  well-   Active Problems:  BPH (benign prostatic hyperplasia) - cont Flomax  GERD (gastroesophageal reflux disease) -cont PPI  Pulmonary nodules- 4 mm or less - see imaging report below Non-contrast chest CT can be considered in 12 months if patient is high-risk.   Discharge Instructions  Discharge Instructions    Diet - low sodium heart healthy    Complete by:  As directed    Diet - low sodium heart healthy    Complete by:  As directed    Drink plenty of water over the next 5-7 days See your Urologist in 1 wk   Increase activity slowly    Complete by:  As directed    Increase activity slowly    Complete by:  As directed      Allergies as of 01/28/2017   No Known Allergies     Medication List    STOP taking these medications   meloxicam 7.5 MG tablet Commonly known as:  MOBIC     TAKE these medications   acetaminophen 325 MG tablet Commonly known as:  TYLENOL Take 2 tablets (650 mg total) by mouth every 6 (six) hours as needed for mild pain (or Fever >/= 101). Do not take near bedtime if you will take Tylenol PM   amoxicillin 875 MG tablet Commonly known as:  AMOXIL Take 1 tablet (875 mg total) by mouth 2 (two) times daily.   diphenhydramine-acetaminophen 25-500 MG Tabs tablet Commonly known as:  TYLENOL PM Take 1-2 tablets by mouth at bedtime as needed (sleep).   esomeprazole 40 MG capsule Commonly known as:  NEXIUM Take 40 mg by mouth daily.   ipratropium 0.03 % nasal spray Commonly  known as:  ATROVENT Place 1 spray into both nostrils 2 (two) times daily.   montelukast 10 MG tablet Commonly known as:  SINGULAIR Take 10 mg by mouth at bedtime.   phenazopyridine 100 MG tablet Commonly known as:  PYRIDIUM Take 1 tablet (100 mg total) by mouth 3 (three) times daily as needed for pain (bladder spasms).   simvastatin 20 MG tablet Commonly known as:  ZOCOR Take 20 mg by mouth daily.   tamsulosin 0.4 MG Caps capsule Commonly known as:   FLOMAX Take 0.8 mg by mouth daily.      Follow-up Information    Claybon Jabs, MD Follow up.   Specialty:  Urology Why:  The office should call you with a follow up appt.  Call for any issues with urination, fevers or flank pain. Contact information: [place] [address] [phone]        Purvis Kilts, MD Follow up in 1 week(s).   Specialty:  Family Medicine Contact information: [address] [place] [address] [phone]          No Known Allergies   Procedures/Studies: R ureter stent  Ct Abdomen Pelvis W Contrast  Result Date: 01/25/2017 CLINICAL DATA:  Right lower quadrant pain x1 week with fever. EXAM: CT ABDOMEN AND PELVIS WITH CONTRAST TECHNIQUE: Multidetector CT imaging of the abdomen and pelvis was performed using the standard protocol following bolus administration of intravenous contrast. CONTRAST:  127mL ISOVUE-300 IOPAMIDOL (ISOVUE-300) INJECTION 61% COMPARISON:  None. FINDINGS: Lower chest: Tiny subpleural nodules in the right lower lobe, measuring on the order of 2 mm to 4 mm, some of which are calcified and may reflect small granulomas. No pulmonary consolidation, effusion or pneumothorax. The visualized cardiac chambers are normal in size. Hepatobiliary: No focal liver abnormality is seen. No gallstones, gallbladder wall thickening, or biliary dilatation. Pancreas: Unremarkable. No pancreatic ductal dilatation or surrounding inflammatory changes. Spleen: Normal in size without focal abnormality. Adrenals/Urinary Tract: Normal bilateral adrenal glands. There appears be tiny calculi in the proximal right ureter in the region of the UPJ, the largest measuring approximately 5 x 3 mm causing right-sided perinephric fat stranding and slight delay in cortical enhancement relative to the left. Bilateral renal simple cysts are noted, on the right, the largest is interpolar and partially parapelvic measuring 5.6 x 3.7 cm and on the left,  also interpolar and parapelvic 3.7 x 2.5 cm. Stomach/Bowel: The appendix is not visualized. However no pericecal inflammation is identified or thickening noted. There is no bowel obstruction. There is colonic diverticulosis without acute diverticulitis. Vascular/Lymphatic: Aortic atherosclerosis. No enlarged abdominal or pelvic lymph nodes. Reproductive: Prostate is mildly enlarged up to 5 cm transverse by 3.6 cm AP and minimally impressing upon the base of the bladder. Other: Tiny fat containing umbilical hernia. No ascites or free air. Musculoskeletal: Degenerative disc disease L5-S1. No acute osseous abnormality. IMPRESSION: 1. Perinephric fat stranding on the right with asymmetric enhancement of the renal cortices, delayed slightly on the right secondary to what appear to be tiny proximal right ureteral stones, the largest approximately 5 x 3 mm. Superimposed urinary tract infection is not excluded. Correlate. 2. Simple appearing bilateral renal cysts. 3. The appendix is not visualized however no pericecal inflammation is identified. No bowel obstruction is noted. 4. Tiny 4 mm or less right lower lobe pulmonary nodules some which are calcified and likely representing small granulomata. No follow-up needed if patient is low-risk (and has no known or suspected primary neoplasm). Non-contrast  chest CT can be considered in 12 months if patient is high-risk. This recommendation follows the consensus statement: Guidelines for Management of Incidental Pulmonary Nodules Detected on CT Images: From the Fleischner Society 2017; Radiology 2017; 284:228-243. Electronically Signed   By: Ashley Royalty M.D.   On: 01/25/2017 20:52       Discharge Exam: Vitals:   01/27/17 2133 01/28/17 0530  BP: (!) 151/83 (!) 122/59  Pulse: 71 68  Resp: 20 20  Temp: 99 F (37.2 C) 98.9 F (37.2 C)   Vitals:   01/27/17 0554 01/27/17 1347 01/27/17 2133 01/28/17 0530  BP: 121/77 (!) 117/59 (!) 151/83 (!) 122/59  Pulse: (!) 57 70 71  68  Resp: 20 18 20 20   Temp: 98.9 F (37.2 C) 99 F (37.2 C) 99 F (37.2 C) 98.9 F (37.2 C)  TempSrc: Oral Oral Oral Oral  SpO2: 100%  99% 96%  Weight:      Height:        General: Pt is alert, awake, not in acute distress Cardiovascular: RRR, S1/S2 +, no rubs, no gallops Respiratory: CTA bilaterally, no wheezing, no rhonchi Abdominal: Soft, NT, ND, bowel sounds + Extremities: no edema, no cyanosis    The results of significant diagnostics from this hospitalization (including imaging, microbiology, ancillary and laboratory) are listed below for reference.     Microbiology: Recent Results (from the past 240 hour(s))  Urine culture     Status: Abnormal   Collection Time: 01/25/17  9:26 PM  Result Value Ref Range Status   Specimen Description URINE, CLEAN CATCH  Final   Special Requests NONE  Final   Culture 60,000 COLONIES/mL ENTEROCOCCUS SPECIES (A)  Final   Report Status 01/28/2017 FINAL  Final   Organism ID, Bacteria ENTEROCOCCUS SPECIES (A)  Final      Susceptibility   Enterococcus species - MIC*    AMPICILLIN <=2 SENSITIVE Sensitive     LEVOFLOXACIN 1 SENSITIVE Sensitive     NITROFURANTOIN 64 INTERMEDIATE Intermediate     VANCOMYCIN <=0.5 SENSITIVE Sensitive     * 60,000 COLONIES/mL ENTEROCOCCUS SPECIES  Urine culture     Status: Abnormal   Collection Time: 01/26/17  2:30 AM  Result Value Ref Range Status   Specimen Description URINE, CATHETERIZED CYSTOSCOPE  Final   Special Requests NONE  Final   Culture 10,000 COLONIES/mL ENTEROCOCCUS SPECIES (A)  Final   Report Status 01/28/2017 FINAL  Final   Organism ID, Bacteria ENTEROCOCCUS SPECIES (A)  Final      Susceptibility   Enterococcus species - MIC*    AMPICILLIN <=2 SENSITIVE Sensitive     LEVOFLOXACIN 0.5 SENSITIVE Sensitive     NITROFURANTOIN 64 INTERMEDIATE Intermediate     VANCOMYCIN <=0.5 SENSITIVE Sensitive     * 10,000 COLONIES/mL ENTEROCOCCUS SPECIES  Culture, blood (x 2)     Status: None    Collection Time: 01/26/17  6:52 AM  Result Value Ref Range Status   Specimen Description BLOOD LEFT ARM  Final   Special Requests BOTTLES DRAWN AEROBIC AND ANAEROBIC 10CC  Final   Culture   Final    NO GROWTH 5 DAYS Performed at [facility], [address], [place], [place] [address]    Report Status 01/31/2017 FINAL  Final  Culture, blood (x 2)     Status: None   Collection Time: 01/26/17  6:53 AM  Result Value Ref Range Status   Specimen Description BLOOD LEFT HAND  Final   Special Requests BOTTLES  DRAWN AEROBIC AND ANAEROBIC 5CC  Final   Culture   Final    NO GROWTH 5 DAYS Performed at [facility], [place] [address] [place],  [address]    Report Status 01/31/2017 FINAL  Final     Labs: BNP (last 3 results) No results for input(s): BNP in the last 8760 hours. Basic Metabolic Panel:  Recent Labs Lab 01/28/17 0636  NA 140  K 4.2  CL 108  CO2 24  GLUCOSE 109*  BUN 19  CREATININE 1.20  CALCIUM 8.9   Liver Function Tests: No results for input(s): AST, ALT, ALKPHOS, BILITOT, PROT, ALBUMIN in the last 168 hours. No results for input(s): LIPASE, AMYLASE in the last 168 hours. No results for input(s): AMMONIA in the last 168 hours. CBC:  Recent Labs Lab 01/28/17 0636  WBC 14.0*  HGB 11.8*  HCT 34.9*  MCV 68.3*  PLT 250   Cardiac Enzymes: No results for input(s): CKTOTAL, CKMB, CKMBINDEX, TROPONINI in the last 168 hours. BNP: Invalid input(s): POCBNP CBG:  Recent Labs Lab 01/28/17 0810  GLUCAP 124*   D-Dimer No results for input(s): DDIMER in the last 72 hours. Hgb A1c No results for input(s): HGBA1C in the last 72 hours. Lipid Profile No results for input(s): CHOL, HDL, LDLCALC, TRIG, CHOLHDL, LDLDIRECT in the last 72 hours. Thyroid function studies No results for input(s): TSH, T4TOTAL, T3FREE, THYROIDAB in the last 72 hours.  Invalid input(s): FREET3 Anemia work up No results for input(s): VITAMINB12, FOLATE, FERRITIN, TIBC,  IRON, RETICCTPCT in the last 72 hours. Urinalysis    Component Value Date/Time   COLORURINE YELLOW 01/25/2017 2051   APPEARANCEUR CLOUDY (A) 01/25/2017 2051   LABSPEC 1.021 01/25/2017 2051   PHURINE 5.0 01/25/2017 2051   GLUCOSEU NEGATIVE 01/25/2017 2051   HGBUR MODERATE (A) 01/25/2017 2051   BILIRUBINUR NEGATIVE 01/25/2017 2051   KETONESUR NEGATIVE 01/25/2017 2051   PROTEINUR 30 (A) 01/25/2017 2051   NITRITE NEGATIVE 01/25/2017 2051   LEUKOCYTESUR LARGE (A) 01/25/2017 2051   Sepsis Labs Invalid input(s): PROCALCITONIN,  WBC,  LACTICIDVEN Microbiology Recent Results (from the past 240 hour(s))  Urine culture     Status: Abnormal   Collection Time: 01/25/17  9:26 PM  Result Value Ref Range Status   Specimen Description URINE, CLEAN CATCH  Final   Special Requests NONE  Final   Culture 60,000 COLONIES/mL ENTEROCOCCUS SPECIES (A)  Final   Report Status 01/28/2017 FINAL  Final   Organism ID, Bacteria ENTEROCOCCUS SPECIES (A)  Final      Susceptibility   Enterococcus species - MIC*    AMPICILLIN <=2 SENSITIVE Sensitive     LEVOFLOXACIN 1 SENSITIVE Sensitive     NITROFURANTOIN 64 INTERMEDIATE Intermediate     VANCOMYCIN <=0.5 SENSITIVE Sensitive     * 60,000 COLONIES/mL ENTEROCOCCUS SPECIES  Urine culture     Status: Abnormal   Collection Time: 01/26/17  2:30 AM  Result Value Ref Range Status   Specimen Description URINE, CATHETERIZED CYSTOSCOPE  Final   Special Requests NONE  Final   Culture 10,000 COLONIES/mL ENTEROCOCCUS SPECIES (A)  Final   Report Status 01/28/2017 FINAL  Final   Organism ID, Bacteria ENTEROCOCCUS SPECIES (A)  Final      Susceptibility   Enterococcus species - MIC*    AMPICILLIN <=2 SENSITIVE Sensitive     LEVOFLOXACIN 0.5 SENSITIVE Sensitive     NITROFURANTOIN 64 INTERMEDIATE Intermediate     VANCOMYCIN <=0.5 SENSITIVE Sensitive     * 10,000  COLONIES/mL ENTEROCOCCUS SPECIES  Culture, blood (x 2)     Status: None   Collection Time: 01/26/17  6:52 AM   Result Value Ref Range Status   Specimen Description BLOOD LEFT ARM  Final   Special Requests BOTTLES DRAWN AEROBIC AND ANAEROBIC 10CC  Final   Culture   Final    NO GROWTH 5 DAYS Performed at [facility], [place] [address] [place], [place] [address]    Report Status 01/31/2017 FINAL  Final  Culture, blood (x 2)     Status: None   Collection Time: 01/26/17  6:53 AM  Result Value Ref Range Status   Specimen Description BLOOD LEFT HAND  Final   Special Requests BOTTLES DRAWN AEROBIC AND ANAEROBIC 5CC  Final   Culture   Final    NO GROWTH 5 DAYS Performed at [facility], [place] [address], [place], [place] [address]    Report Status 01/31/2017 FINAL  Final     Time coordinating discharge: Over 30 minutes  SIGNED:   Debbe Odea, MD  [facility] 02/03/2017, 11:54 AM Pager   If 7PM-7AM, please contact night-coverage [email or web address] Password [email or web address]

## 2017-02-11 ENCOUNTER — Encounter (HOSPITAL_BASED_OUTPATIENT_CLINIC_OR_DEPARTMENT_OTHER): Payer: Self-pay | Admitting: *Deleted

## 2017-02-11 NOTE — Progress Notes (Signed)
NPO AFTER MN WITH EXCEPTION CLEAR LIQUIDS UNTIL 0700 ( NO CREAM [place]).  ARRIVE AT 1130.  CURRENT LAB WORK IN CHART AND EPIC.  WILL TAKE AM MEDS W/ SIPS OF WATER DOS.

## 2017-02-15 NOTE — Progress Notes (Signed)
Pt called the office and said he had a "103" fever over the weekend. He needs a UA on arrival to short stay.

## 2017-02-16 ENCOUNTER — Ambulatory Visit (HOSPITAL_BASED_OUTPATIENT_CLINIC_OR_DEPARTMENT_OTHER): Payer: Medicare Other | Admitting: Anesthesiology

## 2017-02-16 ENCOUNTER — Encounter (HOSPITAL_BASED_OUTPATIENT_CLINIC_OR_DEPARTMENT_OTHER): Payer: Self-pay

## 2017-02-16 ENCOUNTER — Ambulatory Visit (HOSPITAL_BASED_OUTPATIENT_CLINIC_OR_DEPARTMENT_OTHER)
Admission: RE | Admit: 2017-02-16 | Discharge: 2017-02-16 | Disposition: A | Discharge status: Home / Self Care | Payer: Medicare Other | Source: Ambulatory Visit | Attending: Urology | Admitting: Urology

## 2017-02-16 ENCOUNTER — Encounter (HOSPITAL_BASED_OUTPATIENT_CLINIC_OR_DEPARTMENT_OTHER)
Admission: RE | Disposition: A | Discharge status: Home / Self Care | Payer: Self-pay | Source: Ambulatory Visit | Attending: Urology

## 2017-02-16 ENCOUNTER — Other Ambulatory Visit: Payer: Self-pay | Admitting: Urology

## 2017-02-16 DIAGNOSIS — N201 Calculus of ureter: Secondary | ICD-10-CM

## 2017-02-16 DIAGNOSIS — Z538 Procedure and treatment not carried out for other reasons: Secondary | ICD-10-CM | POA: Insufficient documentation

## 2017-02-16 DIAGNOSIS — N12 Tubulo-interstitial nephritis, not specified as acute or chronic: Secondary | ICD-10-CM

## 2017-02-16 HISTORY — DX: Other obstructive and reflux uropathy: N40.1

## 2017-02-16 HISTORY — DX: Other specified abnormal immunological findings in serum: R76.8

## 2017-02-16 HISTORY — DX: Personal history of urinary (tract) infections: Z87.440

## 2017-02-16 HISTORY — DX: Thalassemia minor: D56.3

## 2017-02-16 HISTORY — DX: Benign prostatic hyperplasia with lower urinary tract symptoms: N13.8

## 2017-02-16 HISTORY — DX: Calculus of ureter: N20.1

## 2017-02-16 LAB — CBC
HEMATOCRIT: 34.4 % — AB (ref 39.0–52.0)
HEMOGLOBIN: 11.6 g/dL — AB (ref 13.0–17.0)
MCH: 24 pg — AB (ref 26.0–34.0)
MCHC: 33.7 g/dL (ref 30.0–36.0)
MCV: 71.2 fL — ABNORMAL LOW (ref 78.0–100.0)
Platelets: 221 10*3/uL (ref 150–400)
RBC: 4.83 MIL/uL (ref 4.22–5.81)
RDW: 15.2 % (ref 11.5–15.5)
WBC: 10.8 10*3/uL — AB (ref 4.0–10.5)

## 2017-02-16 LAB — URINALYSIS, ROUTINE W REFLEX MICROSCOPIC
Bilirubin Urine: NEGATIVE
Glucose, UA: NEGATIVE mg/dL
Ketones, ur: NEGATIVE mg/dL
Nitrite: NEGATIVE
PH: 5 (ref 5.0–8.0)
Protein, ur: 30 mg/dL — AB
SQUAMOUS EPITHELIAL / LPF: NONE SEEN
Specific Gravity, Urine: 1.017 (ref 1.005–1.030)

## 2017-02-16 SURGERY — CYSTOSCOPY/URETEROSCOPY/HOLMIUM LASER/STENT PLACEMENT
Anesthesia: General | Laterality: Right

## 2017-02-16 MED ORDER — LEVOFLOXACIN IN D5W 500 MG/100ML IV SOLN
500.0000 mg | INTRAVENOUS | Status: DC
  Filled 2017-02-16: qty 100

## 2017-02-16 MED ORDER — MIDAZOLAM HCL 2 MG/2ML IJ SOLN
INTRAMUSCULAR | Status: AC
  Filled 2017-02-16: qty 2

## 2017-02-16 MED ORDER — LEVOFLOXACIN IN D5W 500 MG/100ML IV SOLN
INTRAVENOUS | Status: AC
  Filled 2017-02-16: qty 100

## 2017-02-16 MED ORDER — FENTANYL CITRATE (PF) 100 MCG/2ML IJ SOLN
INTRAMUSCULAR | Status: AC
Start: 2017-02-16 — End: 2017-02-16
  Filled 2017-02-16: qty 2

## 2017-02-16 MED ORDER — PROPOFOL 10 MG/ML IV BOLUS
INTRAVENOUS | Status: AC
  Filled 2017-02-16: qty 20

## 2017-02-16 MED ORDER — AMOXICILLIN-POT CLAVULANATE 875-125 MG PO TABS: 1.0000 | ORAL_TABLET | Freq: Two times a day (BID) | ORAL | 0 refills | Status: DC

## 2017-02-16 MED ORDER — LACTATED RINGERS IV SOLN
INTRAVENOUS | Status: DC
  Administered 2017-02-16: 12:00:00 via INTRAVENOUS
  Filled 2017-02-16: qty 1000

## 2017-02-16 MED ORDER — LEVOFLOXACIN 250 MG PO TABS: 250.0000 mg | ORAL_TABLET | Freq: Every day | ORAL | 0 refills | Status: DC

## 2017-02-16 MED ORDER — LIDOCAINE 2% (20 MG/ML) 5 ML SYRINGE
INTRAMUSCULAR | Status: AC
  Filled 2017-02-16: qty 5

## 2017-02-16 SURGICAL SUPPLY — 32 items
BAG DRAIN URO-CYSTO SKYTR STRL (DRAIN) IMPLANT
BASKET LASER NITINOL 1.9FR (BASKET) IMPLANT
BASKET STNLS GEMINI 4WIRE 3FR (BASKET) IMPLANT
BASKET ZERO TIP NITINOL 2.4FR (BASKET) IMPLANT
CATH INTERMIT  6FR 70CM (CATHETERS) IMPLANT
CATH URET 5FR 28IN CONE TIP (BALLOONS)
CATH URET 5FR 28IN OPEN ENDED (CATHETERS) IMPLANT
CATH URET 5FR 70CM CONE TIP (BALLOONS) IMPLANT
CATH URET DUAL LUMEN 6-10FR 50 (CATHETERS) IMPLANT
CLOTH BEACON ORANGE TIMEOUT ST (SAFETY) IMPLANT
ELECT REM PT RETURN 9FT ADLT (ELECTROSURGICAL)
ELECTRODE REM PT RTRN 9FT ADLT (ELECTROSURGICAL) IMPLANT
GLOVE BIO SURGEON STRL SZ7.5 (GLOVE) IMPLANT
GOWN STRL REUS W/ TWL LRG LVL3 (GOWN DISPOSABLE) IMPLANT
GOWN STRL REUS W/ TWL XL LVL3 (GOWN DISPOSABLE) IMPLANT
GOWN STRL REUS W/TWL LRG LVL3 (GOWN DISPOSABLE)
GOWN STRL REUS W/TWL XL LVL3 (GOWN DISPOSABLE)
GUIDEWIRE 0.038 PTFE COATED (WIRE) IMPLANT
GUIDEWIRE ANG ZIPWIRE 038X150 (WIRE) IMPLANT
GUIDEWIRE STR DUAL SENSOR (WIRE) IMPLANT
IV NS IRRIG 3000ML ARTHROMATIC (IV SOLUTION) IMPLANT
KIT BALLIN UROMAX 15FX10 (LABEL) IMPLANT
KIT BALLN UROMAX 15FX4 (MISCELLANEOUS) IMPLANT
KIT BALLN UROMAX 26 75X4 (MISCELLANEOUS)
KIT RM TURNOVER CYSTO AR (KITS) IMPLANT
MANIFOLD NEPTUNE II (INSTRUMENTS) IMPLANT
PACK CYSTO (CUSTOM PROCEDURE TRAY) IMPLANT
SET HIGH PRES BAL DIL (LABEL)
SHEATH ACCESS URETERAL 38CM (SHEATH) IMPLANT
SYRINGE IRR TOOMEY STRL 70CC (SYRINGE) IMPLANT
TUBE CONNECTING 12'X1/4 (SUCTIONS)
TUBE CONNECTING 12X1/4 (SUCTIONS) IMPLANT

## 2017-02-16 NOTE — Anesthesia Preprocedure Evaluation (Signed)
Anesthesia Evaluation  Patient identified by MRN, date of birth, ID band Patient awake    Reviewed: Allergy & Precautions, NPO status , Patient's Chart, lab work & pertinent test results  Airway Mallampati: II  TM Distance: >3 FB Neck ROM: Full    Dental no notable dental hx.    Pulmonary neg pulmonary ROS, former smoker,    Pulmonary exam normal breath sounds clear to auscultation       Cardiovascular negative cardio ROS Normal cardiovascular exam Rhythm:Regular Rate:Normal     Neuro/Psych negative neurological ROS  negative psych ROS   GI/Hepatic Neg liver ROS, GERD  Medicated,  Endo/Other  negative endocrine ROS  Renal/GU negative Renal ROS  negative genitourinary   Musculoskeletal negative musculoskeletal ROS (+)   Abdominal   Peds negative pediatric ROS (+)  Hematology negative hematology ROS (+)   Anesthesia Other Findings   Reproductive/Obstetrics negative OB ROS                             Anesthesia Physical Anesthesia Plan  ASA: II  Anesthesia Plan: General   Post-op Pain Management:    Induction: Intravenous  Airway Management Planned: LMA  Additional Equipment:   Intra-op Plan:   Post-operative Plan: Extubation in OR  Informed Consent: I have reviewed the patients History and Physical, chart, labs and discussed the procedure including the risks, benefits and alternatives for the proposed anesthesia with the patient or authorized representative who has indicated his/her understanding and acceptance.   Dental advisory given  Plan Discussed with: CRNA and Surgeon  Anesthesia Plan Comments:         Anesthesia Quick Evaluation

## 2017-02-16 NOTE — Progress Notes (Signed)
Surgery cancelled per Dr. Junious Silk . See order. IV Dc.d.  Walked to door.

## 2017-02-17 LAB — URINE CULTURE: CULTURE: NO GROWTH

## 2017-02-26 ENCOUNTER — Encounter (HOSPITAL_BASED_OUTPATIENT_CLINIC_OR_DEPARTMENT_OTHER): Payer: Self-pay | Admitting: *Deleted

## 2017-02-26 NOTE — Progress Notes (Signed)
NPO AFTER MN.  ARRIVE AT 0745.  CURRENT LAB RESULTS IN CHART AND EPIC.  WILL TAKE AM MEDS W/ SIPS OF WATER DOS.

## 2017-03-05 ENCOUNTER — Ambulatory Visit (HOSPITAL_BASED_OUTPATIENT_CLINIC_OR_DEPARTMENT_OTHER): Payer: Medicare Other | Admitting: Anesthesiology

## 2017-03-05 ENCOUNTER — Ambulatory Visit (HOSPITAL_BASED_OUTPATIENT_CLINIC_OR_DEPARTMENT_OTHER)
Admission: RE | Admit: 2017-03-05 | Discharge: 2017-03-05 | Disposition: A | Discharge status: Home / Self Care | Payer: Medicare Other | Source: Ambulatory Visit | Attending: Urology | Admitting: Urology

## 2017-03-05 ENCOUNTER — Encounter (HOSPITAL_BASED_OUTPATIENT_CLINIC_OR_DEPARTMENT_OTHER)
Admission: RE | Disposition: A | Discharge status: Home / Self Care | Payer: Self-pay | Source: Ambulatory Visit | Attending: Urology

## 2017-03-05 ENCOUNTER — Encounter (HOSPITAL_BASED_OUTPATIENT_CLINIC_OR_DEPARTMENT_OTHER): Payer: Self-pay | Admitting: *Deleted

## 2017-03-05 DIAGNOSIS — Z791 Long term (current) use of non-steroidal anti-inflammatories (NSAID): Secondary | ICD-10-CM | POA: Diagnosis not present

## 2017-03-05 DIAGNOSIS — N401 Enlarged prostate with lower urinary tract symptoms: Secondary | ICD-10-CM | POA: Diagnosis not present

## 2017-03-05 DIAGNOSIS — N1330 Unspecified hydronephrosis: Secondary | ICD-10-CM | POA: Insufficient documentation

## 2017-03-05 DIAGNOSIS — N400 Enlarged prostate without lower urinary tract symptoms: Secondary | ICD-10-CM | POA: Diagnosis not present

## 2017-03-05 DIAGNOSIS — K219 Gastro-esophageal reflux disease without esophagitis: Secondary | ICD-10-CM | POA: Diagnosis not present

## 2017-03-05 DIAGNOSIS — Z79899 Other long term (current) drug therapy: Secondary | ICD-10-CM | POA: Diagnosis not present

## 2017-03-05 DIAGNOSIS — N201 Calculus of ureter: Secondary | ICD-10-CM

## 2017-03-05 DIAGNOSIS — N2889 Other specified disorders of kidney and ureter: Secondary | ICD-10-CM | POA: Diagnosis not present

## 2017-03-05 DIAGNOSIS — N12 Tubulo-interstitial nephritis, not specified as acute or chronic: Secondary | ICD-10-CM | POA: Diagnosis not present

## 2017-03-05 DIAGNOSIS — E785 Hyperlipidemia, unspecified: Secondary | ICD-10-CM | POA: Insufficient documentation

## 2017-03-05 DIAGNOSIS — Z87891 Personal history of nicotine dependence: Secondary | ICD-10-CM | POA: Insufficient documentation

## 2017-03-05 DIAGNOSIS — N2886 Ureteritis cystica: Secondary | ICD-10-CM | POA: Diagnosis not present

## 2017-03-05 DIAGNOSIS — N138 Other obstructive and reflux uropathy: Secondary | ICD-10-CM | POA: Insufficient documentation

## 2017-03-05 DIAGNOSIS — N132 Hydronephrosis with renal and ureteral calculous obstruction: Secondary | ICD-10-CM | POA: Diagnosis present

## 2017-03-05 DIAGNOSIS — N3289 Other specified disorders of bladder: Secondary | ICD-10-CM | POA: Diagnosis not present

## 2017-03-05 DIAGNOSIS — Z466 Encounter for fitting and adjustment of urinary device: Secondary | ICD-10-CM | POA: Diagnosis not present

## 2017-03-05 HISTORY — PX: CYSTOSCOPY WITH URETEROSCOPY AND STENT PLACEMENT: SHX6377

## 2017-03-05 SURGERY — CYSTOURETEROSCOPY, WITH STENT INSERTION
Anesthesia: General | Laterality: Right

## 2017-03-05 MED ORDER — HYDROMORPHONE HCL 1 MG/ML IJ SOLN
0.2500 mg | INTRAMUSCULAR | Status: DC | PRN
  Filled 2017-03-05: qty 0.5

## 2017-03-05 MED ORDER — LIDOCAINE 2% (20 MG/ML) 5 ML SYRINGE
INTRAMUSCULAR | Status: DC | PRN
  Administered 2017-03-05: 100 mg via INTRAVENOUS

## 2017-03-05 MED ORDER — PROMETHAZINE HCL 25 MG/ML IJ SOLN
6.2500 mg | INTRAMUSCULAR | Status: DC | PRN
  Filled 2017-03-05: qty 1

## 2017-03-05 MED ORDER — PROPOFOL 500 MG/50ML IV EMUL
INTRAVENOUS | Status: DC | PRN
  Administered 2017-03-05: 200 mL via INTRAVENOUS

## 2017-03-05 MED ORDER — LEVOFLOXACIN IN D5W 500 MG/100ML IV SOLN
INTRAVENOUS | Status: DC | PRN
  Administered 2017-03-05: 500 mg via INTRAVENOUS

## 2017-03-05 MED ORDER — OXYCODONE HCL 5 MG PO TABS
5.0000 mg | ORAL_TABLET | Freq: Once | ORAL | Status: DC | PRN
  Filled 2017-03-05: qty 1

## 2017-03-05 MED ORDER — PHENYLEPHRINE 40 MCG/ML (10ML) SYRINGE FOR IV PUSH (FOR BLOOD PRESSURE SUPPORT)
PREFILLED_SYRINGE | INTRAVENOUS | Status: AC
  Filled 2017-03-05: qty 10

## 2017-03-05 MED ORDER — OXYCODONE HCL 5 MG/5ML PO SOLN
5.0000 mg | Freq: Once | ORAL | Status: DC | PRN
  Filled 2017-03-05: qty 5

## 2017-03-05 MED ORDER — MIDAZOLAM HCL 2 MG/2ML IJ SOLN
INTRAMUSCULAR | Status: AC
  Filled 2017-03-05: qty 2

## 2017-03-05 MED ORDER — LIDOCAINE 2% (20 MG/ML) 5 ML SYRINGE
INTRAMUSCULAR | Status: AC
Start: 2017-03-05 — End: 2017-03-05
  Filled 2017-03-05: qty 5

## 2017-03-05 MED ORDER — EPHEDRINE 5 MG/ML INJ
INTRAVENOUS | Status: AC
  Filled 2017-03-05: qty 10

## 2017-03-05 MED ORDER — LEVOFLOXACIN IN D5W 500 MG/100ML IV SOLN
INTRAVENOUS | Status: AC
  Filled 2017-03-05: qty 100

## 2017-03-05 MED ORDER — FENTANYL CITRATE (PF) 100 MCG/2ML IJ SOLN
INTRAMUSCULAR | Status: AC
  Filled 2017-03-05: qty 2

## 2017-03-05 MED ORDER — LACTATED RINGERS IV SOLN
INTRAVENOUS | Status: DC
  Administered 2017-03-05 (×2): via INTRAVENOUS
  Filled 2017-03-05: qty 1000

## 2017-03-05 MED ORDER — PHENYLEPHRINE HCL 10 MG/ML IJ SOLN
INTRAMUSCULAR | Status: DC | PRN
  Administered 2017-03-05 (×3): 120 ug via INTRAVENOUS

## 2017-03-05 MED ORDER — DEXAMETHASONE SODIUM PHOSPHATE 10 MG/ML IJ SOLN
INTRAMUSCULAR | Status: DC | PRN
  Administered 2017-03-05: 10 mg via INTRAVENOUS

## 2017-03-05 MED ORDER — SODIUM CHLORIDE 0.9 % IR SOLN
Status: DC | PRN
  Administered 2017-03-05: 6000 mL

## 2017-03-05 MED ORDER — EPHEDRINE SULFATE 50 MG/ML IJ SOLN
INTRAMUSCULAR | Status: DC | PRN
  Administered 2017-03-05: 15 mg via INTRAVENOUS

## 2017-03-05 MED ORDER — FENTANYL CITRATE (PF) 100 MCG/2ML IJ SOLN
INTRAMUSCULAR | Status: DC | PRN
  Administered 2017-03-05 (×2): 50 ug via INTRAVENOUS

## 2017-03-05 MED ORDER — PROPOFOL 10 MG/ML IV BOLUS
INTRAVENOUS | Status: AC
  Filled 2017-03-05: qty 20

## 2017-03-05 MED ORDER — MIDAZOLAM HCL 5 MG/5ML IJ SOLN
INTRAMUSCULAR | Status: DC | PRN
  Administered 2017-03-05: 2 mg via INTRAVENOUS

## 2017-03-05 MED ORDER — MEPERIDINE HCL 25 MG/ML IJ SOLN
6.2500 mg | INTRAMUSCULAR | Status: DC | PRN
  Filled 2017-03-05: qty 1

## 2017-03-05 SURGICAL SUPPLY — 34 items
BAG DRAIN URO-CYSTO SKYTR STRL (DRAIN) ×3 IMPLANT
BASKET LASER NITINOL 1.9FR (BASKET) IMPLANT
BASKET STNLS GEMINI 4WIRE 3FR (BASKET) IMPLANT
BASKET ZERO TIP NITINOL 2.4FR (BASKET) ×3 IMPLANT
CATH INTERMIT  6FR 70CM (CATHETERS) IMPLANT
CATH URET 5FR 28IN CONE TIP (BALLOONS)
CATH URET 5FR 28IN OPEN ENDED (CATHETERS) ×3 IMPLANT
CATH URET 5FR 70CM CONE TIP (BALLOONS) IMPLANT
CATH URET DUAL LUMEN 6-10FR 50 (CATHETERS) IMPLANT
CLOTH BEACON ORANGE TIMEOUT ST (SAFETY) ×3 IMPLANT
ELECT REM PT RETURN 9FT ADLT (ELECTROSURGICAL)
ELECTRODE REM PT RTRN 9FT ADLT (ELECTROSURGICAL) IMPLANT
GLOVE BIO SURGEON STRL SZ7.5 (GLOVE) ×3 IMPLANT
GOWN STRL REUS W/ TWL LRG LVL3 (GOWN DISPOSABLE) ×1 IMPLANT
GOWN STRL REUS W/ TWL XL LVL3 (GOWN DISPOSABLE) ×1 IMPLANT
GOWN STRL REUS W/TWL LRG LVL3 (GOWN DISPOSABLE) ×2
GOWN STRL REUS W/TWL XL LVL3 (GOWN DISPOSABLE) ×2
GUIDEWIRE 0.038 PTFE COATED (WIRE) ×3 IMPLANT
GUIDEWIRE ANG ZIPWIRE 038X150 (WIRE) ×3 IMPLANT
GUIDEWIRE STR DUAL SENSOR (WIRE) ×3 IMPLANT
IV NS IRRIG 3000ML ARTHROMATIC (IV SOLUTION) ×6 IMPLANT
KIT BALLIN UROMAX 15FX10 (LABEL) IMPLANT
KIT BALLN UROMAX 15FX4 (MISCELLANEOUS) IMPLANT
KIT BALLN UROMAX 26 75X4 (MISCELLANEOUS)
KIT RM TURNOVER CYSTO AR (KITS) ×3 IMPLANT
MANIFOLD NEPTUNE II (INSTRUMENTS) IMPLANT
PACK CYSTO (CUSTOM PROCEDURE TRAY) ×3 IMPLANT
SET HIGH PRES BAL DIL (LABEL)
SHEATH ACCESS URETERAL 38CM (SHEATH) IMPLANT
SHEATH ACCESS URETERAL 54CM (SHEATH) ×3 IMPLANT
STENT URET 6FRX26 CONTOUR (STENTS) ×3 IMPLANT
SYRINGE IRR TOOMEY STRL 70CC (SYRINGE) IMPLANT
TUBE CONNECTING 12'X1/4 (SUCTIONS) ×1
TUBE CONNECTING 12X1/4 (SUCTIONS) ×2 IMPLANT

## 2017-03-05 NOTE — Discharge Instructions (Signed)
Ureteral Stent Implantation, Care After Refer to this sheet in the next few weeks. These instructions provide you with information about caring for yourself after your procedure. Your health care provider may also give you more specific instructions. Your treatment has been planned according to current medical practices, but problems sometimes occur. Call your health care provider if you have any problems or questions after your procedure.  REMOVAL OF THE STENT:  Remove the stent by pulling the string was slow steady pressure until the entire stent is removed on Tuesday morning, 03/09/2017.  What can I expect after the procedure? After the procedure, it is common to have:  Nausea.  Mild pain when you urinate. You may feel this pain in your lower back or lower abdomen. Pain should stop within a few minutes after you urinate. This may last for up to 1 week.  A small amount of blood in your urine for several days. Follow these instructions at home:   Medicines   Take over-the-counter and prescription medicines only as told by your health care provider.  If you were prescribed an antibiotic medicine, take it as told by your health care provider. Do not stop taking the antibiotic even if you start to feel better.  Do not drive for 24 hours if you received a sedative.  Do not drive or operate heavy machinery while taking prescription pain medicines. Activity   Return to your normal activities as told by your health care provider. Ask your health care provider what activities are safe for you.  Do not lift anything that is heavier than 10 lb (4.5 kg). Follow this limit for 1 week after your procedure, or for as long as told by your health care provider. General instructions   Watch for any blood in your urine. Call your health care provider if the amount of blood in your urine increases.  If you have a catheter:  Follow instructions from your health care provider about taking care of  your catheter and collection bag.  Do not take baths, swim, or use a hot tub until your health care provider approves.  Drink enough fluid to keep your urine clear or pale yellow.  Keep all follow-up visits as told by your health care provider. This is important. Contact a health care provider if:  You have pain that gets worse or does not get better with medicine, especially pain when you urinate.  You have difficulty urinating.  You feel nauseous or you vomit repeatedly during a period of more than 2 days after the procedure. Get help right away if:  Your urine is dark red or has blood clots in it.  You are leaking urine (have incontinence).  The end of the stent comes out of your urethra.  You cannot urinate.  You have sudden, sharp, or severe pain in your abdomen or lower back.  You have a fever. This information is not intended to replace advice given to you by your health care provider. Make sure you discuss any questions you have with your health care provider. Document Released: 07/12/2013 Document Revised: 04/16/2016 Document Reviewed: 05/24/2015 Elsevier Interactive Patient Education  2017 [place] [facility] Care Instructions  Activity: Get plenty of rest for the remainder of the day. A responsible individual must stay with you for 24 hours following the procedure.  For the next 24 hours, DO NOT: -Drive a car -Paediatric nurse -Drink alcoholic beverages -Take any medication unless instructed by your physician -Make any  legal decisions or sign important papers.  Meals: Start with liquid foods such as gelatin or soup. Progress to regular foods as tolerated. Avoid greasy, spicy, heavy foods. If nausea and/or vomiting occur, drink only clear liquids until the nausea and/or vomiting subsides. Call your physician if vomiting continues.  Special Instructions/Symptoms: Your throat may feel dry or sore from the anesthesia or the breathing tube placed  in your throat during surgery. If this causes discomfort, gargle with warm salt water. The discomfort should disappear within 24 hours.  If you had a scopolamine patch placed behind your ear for the management of post- operative nausea and/or vomiting:  1. The medication in the patch is effective for 72 hours, after which it should be removed.  Wrap patch in a tissue and discard in the trash. Wash hands thoroughly with soap and water. 2. You may remove the patch earlier than 72 hours if you experience unpleasant side effects which may include dry mouth, dizziness or visual disturbances. 3. Avoid touching the patch. Wash your hands with soap and water after contact with the patch.

## 2017-03-05 NOTE — Anesthesia Procedure Notes (Signed)
Procedure Name: LMA Insertion Date/Time: 03/05/2017 9:48 AM Performed by: Wanita Chamberlain Pre-anesthesia Checklist: Patient identified, Timeout performed, Emergency Drugs available, Suction available and Patient being monitored Patient Re-evaluated:Patient Re-evaluated prior to inductionOxygen Delivery Method: Circle system utilized Preoxygenation: Pre-oxygenation with 100% oxygen Intubation Type: IV induction Ventilation: Mask ventilation without difficulty LMA: LMA inserted LMA Size: 5.0 Number of attempts: 1 Placement Confirmation: breath sounds checked- equal and bilateral and positive ETCO2 Tube secured with: Tape Dental Injury: Teeth and Oropharynx as per pre-operative assessment

## 2017-03-05 NOTE — Anesthesia Preprocedure Evaluation (Addendum)
Anesthesia Evaluation  Patient identified by MRN, date of birth, ID band Patient awake    Reviewed: Allergy & Precautions, NPO status , Patient's Chart, lab work & pertinent test results  Airway Mallampati: II  TM Distance: >3 FB Neck ROM: Full    Dental no notable dental hx. (+) Teeth Intact, Dental Advisory Given, Implants,    Pulmonary neg pulmonary ROS, former smoker,    Pulmonary exam normal breath sounds clear to auscultation       Cardiovascular negative cardio ROS Normal cardiovascular exam Rhythm:Regular Rate:Normal     Neuro/Psych negative neurological ROS  negative psych ROS   GI/Hepatic Neg liver ROS, GERD  Medicated and Controlled,  Endo/Other  negative endocrine ROS  Renal/GU negative Renal ROS  negative genitourinary   Musculoskeletal negative musculoskeletal ROS (+) Arthritis , Osteoarthritis,    Abdominal   Peds negative pediatric ROS (+)  Hematology negative hematology ROS (+)   Anesthesia Other Findings   Reproductive/Obstetrics negative OB ROS                            Anesthesia Physical  Anesthesia Plan  ASA: II  Anesthesia Plan: General   Post-op Pain Management:    Induction: Intravenous  Airway Management Planned: LMA  Additional Equipment:   Intra-op Plan:   Post-operative Plan: Extubation in OR  Informed Consent: I have reviewed the patients History and Physical, chart, labs and discussed the procedure including the risks, benefits and alternatives for the proposed anesthesia with the patient or authorized representative who has indicated his/her understanding and acceptance.   Dental advisory given  Plan Discussed with: CRNA and Surgeon  Anesthesia Plan Comments:         Anesthesia Quick Evaluation

## 2017-03-05 NOTE — Op Note (Signed)
Preoperative diagnosis: Right UPJ stone Postoperative diagnosis: Right ureteral inflammation -- N28.86  Procedure: Cystoscopy with right ureteroscopy  Surgeon: Junious Silk  Anesthesia: Gen.  Indication for procedure: James Mccall is a 71 year old who presented with a septic-like picture mild right hydroureteronephrosis  and some small stones or debris at the right UPJ. Urine culture grew enterococcus. Because of the equivocal findings he was brought for direct ureteroscopic look after treatment with antibiotics.  Findings: On cystoscopy the urethra was normal, the prostatic urethra was enlarged with I bladder neck and visual obstruction. The bladder appeared normal. The mucosa was normal. There was mild trabeculation. There was no stone in the bladder. There was a right ureteral stent. The ureteral orifices and trigone appeared normal.  On right ureteroscopy there was a lot of stone-like debris mixed with small clot or protein throughout the system but no specific stones. I would say the ureter was a bit narrow over the iliac and a bit narrow at the UPJ compared to the typical dilation one might expect after a stent has been in for a few weeks. The collecting system and renal pelvis appeared normal.  Description of procedure: After consent was obtained patient brought to the operating room. After adequate anesthesia he is placed in lithotomy position and prepped and draped in usual sterile fashion. A timeout was performed to confirm the patient and procedure. The cystoscope was passed per urethra and the bladder inspected. The right ureteral stent was grasped and brought through the urethral meatus. I passed a sensor wire into the collecting system and remove the stent. I then passed a semirigid ureteroscope and was able to inspect the ureter all the way up to the UPJ. This appeared normal without stone or mass. Some inflammation and tan stonelike debris expected with a stent. I then placed a Glidewire  under direct vision and backed out the ureteroscope. The ureteral access sheath was passed and it was a little snug at the iliacs but it passed into the proximal ureter without difficulty. I then passed the dual channel digital ureteroscope and the proximal ureter and UPJ just accommodated the scope and the wire and was a little bit snug. The upper middle and lower pole collecting system was carefully inspected twice and no significant findings were found. The renal pelvis appeared normal. Backed out the ureteroscope and inspected the UPJ and proximal ureter again which noted to be narrow but normal. The access sheath and the scope or backed out together and the ureter appeared normal without injury or stone fragment. There was some tissue that appeared near the iliac but this washed away and I think this was just where the mucosa had been scraped off by the access sheath. The Glidewire was backloaded on the cystoscope and a 6 x 26 and a meter stent was advanced. The wire was removed with a good coil seen in the kidney and a good coil in the bladder. The bladder was drained and the scope removed. The patient was awakened and taken to the recovery room in stable condition.  Complications: None  Blood loss: Minimal  Specimens: None  Drains: 6 x 26 cm right ureteral stent with string  Disposition: Patient stable to PACU

## 2017-03-05 NOTE — Anesthesia Postprocedure Evaluation (Signed)
Anesthesia Post Note  Patient: James Mccall  Procedure(s) Performed: Procedure(s) (LRB): cystoscopy with right ureteroscopy and stent exchange  (Right)  Patient location during evaluation: PACU Anesthesia Type: General Level of consciousness: sedated and patient cooperative Pain management: pain level controlled Vital Signs Assessment: post-procedure vital signs reviewed and stable Respiratory status: spontaneous breathing Cardiovascular status: stable Anesthetic complications: no       Last Vitals:  Vitals:   03/05/17 1145 03/05/17 1215  BP: 138/82 137/70  Pulse: 76 73  Resp: 14 14  Temp:  36.5 C    Last Pain:  Vitals:   03/05/17 1052  TempSrc:   PainSc: [place]

## 2017-03-05 NOTE — H&P (Signed)
. H&P  Chief Complaint: right ureteral stone   History of Present Illness: Patient is a 71 y.o. male with history of Oa, BPH, GERD, HLD who presented with fevers of 102.7, WBC 15, infected urine with CT scan showing right sided perinephric stranding and delayed cortical enhancement with multiple small tiny stones (by report up to 5 x 21mm) at the right UPJ/proximal ureter. He underwent an urgent right stent and was treated for enterococcal UTI. I had to delay him a couple weeks ago due to fever and bacteriuria. Follow-up culture was negative and patient has remained on antibiotics. He looks well today. He was brought today for right ureteroscopy.  Past Medical History:  Diagnosis Date  . Arthritis   . Beta thalassemia minor    dx 08/ 2016 (due to microcytosis) by oncologist-- dr Everardo All ([facility] in [place])  . BPH with obstruction/lower urinary tract symptoms   . Coombs positive    per dr Everardo All (oncologist note from [facility] in [place])   . GERD (gastroesophageal reflux disease)   . History of acute pyelonephritis    w/ sepsis 01-26-2017 due to kidney stone obstruction  . Hyperlipidemia   . Right ureteral stone   . Seasonal allergies    Past Surgical History:  Procedure Laterality Date  . CATARACT EXTRACTION W/PHACO Right 08/15/2015   Procedure: CATARACT EXTRACTION PHACO AND INTRAOCULAR LENS PLACEMENT (IOC);  Surgeon: Tonny Branch, MD;  Location: [facility];  Service: Ophthalmology;  Laterality: Right;  CDE:5.75  . CATARACT EXTRACTION W/PHACO Left 09/02/2015   Procedure: CATARACT EXTRACTION PHACO AND INTRAOCULAR LENS PLACEMENT LEFT EYE CDE=5.93;  Surgeon: Tonny Branch, MD;  Location: [facility];  Service: Ophthalmology;  Laterality: Left;  . COLONOSCOPY N/A 08/28/2013   Procedure: COLONOSCOPY;  Surgeon: Daneil Dolin, MD;  [facility];  Service: Endoscopy;  Laterality: N/A;  9:45  . CYSTOSCOPY W/ URETERAL STENT PLACEMENT Right 01/26/2017   Procedure: CYSTOSCOPY WITH  RETROGRADE PYELOGRAM/URETERAL STENT PLACEMENT;  Surgeon: Irine Seal, MD;  Location: [facility];  Service: Urology;  Laterality: Right;  . NASAL SINUS SURGERY  2012  . ROTATOR CUFF REPAIR Bilateral 1998  . vocal cord polyps  1990's   benign    Home Medications:  Prescriptions Prior to Admission  Medication Sig Dispense Refill Last Dose  . acetaminophen (TYLENOL) 325 MG tablet Take 2 tablets (650 mg total) by mouth every 6 (six) hours as needed for mild pain (or Fever >/= 101). Do not take near bedtime if you will take Tylenol PM   Past Month at Unknown time  . diphenhydramine-acetaminophen (TYLENOL PM) 25-500 MG TABS Take 1-2 tablets by mouth at bedtime as needed (sleep).   Past Week at Unknown time  . esomeprazole (NEXIUM) 40 MG capsule Take 40 mg by mouth every morning.    03/05/2017 at 0530  . ipratropium (ATROVENT) 0.03 % nasal spray Place 1 spray into both nostrils 2 (two) times daily as needed.    03/04/2017 at Unknown time  . levofloxacin (LEVAQUIN) 250 MG tablet Take 1 tablet (250 mg total) by mouth daily. 30 tablet 0 03/04/2017  . montelukast (SINGULAIR) 10 MG tablet Take 10 mg by mouth every morning.    03/05/2017 at 0530  . naproxen sodium (ANAPROX) 220 MG tablet Take 220 mg by mouth once.   03/04/2017 at Unknown time  . phenazopyridine (PYRIDIUM) 100 MG tablet Take 1 tablet (100 mg total) by mouth 3 (three) times daily as needed for pain (bladder spasms). 10 tablet  0 03/05/2017 at 0530  . simvastatin (ZOCOR) 20 MG tablet Take 20 mg by mouth every morning.    03/05/2017 at 0530  . tamsulosin (FLOMAX) 0.4 MG CAPS capsule Take 0.8 mg by mouth daily after breakfast.    03/05/2017 at 0530   Allergies: No Known Allergies  Family History  Problem Relation Age of Onset  . Colon cancer Neg Hx    Social History:  reports that he quit smoking about 32 years ago. His smoking use included Cigarettes. He has a 22.50 pack-year smoking history. He has never used smokeless tobacco. He reports that he drinks  alcohol. He reports that he does not use drugs.  ROS: A complete review of systems was performed.  All systems are negative except for pertinent findings as noted. ROS   Physical Exam:  Vital signs in last 24 hours: Temp:  [97.9 F (36.6 C)] 97.9 F (36.6 C) (04/13 0759) Pulse Rate:  [72] 72 (04/13 0759) Resp:  [16] 16 (04/13 0759) BP: (138)/(76) 138/76 (04/13 0759) SpO2:  [97 %] 97 % (04/13 0759) Weight:  [88 kg (194 lb)] 88 kg (194 lb) (04/13 0759) General:  Alert and oriented, No acute distress HEENT: Normocephalic, atraumatic Neck: No JVD or lymphadenopathy Cardiovascular: Regular rate and rhythm Lungs: Regular rate and effort Abdomen: Soft, nontender, nondistended, no abdominal masses Back: No CVA tenderness Extremities: No edema Neurologic: Grossly intact  Laboratory Data:  No results found for this or any previous visit (from the past 24 hour(s)). No results found for this or any previous visit (from the past 240 hour(s)). Creatinine: No results for input(s): CREATININE in the last 168 hours.  Impression/Assessment/plan:  I discussed with the patient the nature, potential benefits, risks and alternatives to cysto, right URS, HLL, stent, including side effects of the proposed treatment, the likelihood of the patient achieving the goals of the procedure, and any potential problems that might occur during the procedure or recuperation. All questions answered. Patient elects to proceed.      Marieanne Marxen 03/05/2017, 9:53 AM

## 2017-03-05 NOTE — Transfer of Care (Signed)
Immediate Anesthesia Transfer of Care Note  Patient: James Mccall  Procedure(s) Performed: Procedure(s): cystoscopy with right ureteroscopy and stent exchange  (Right)  Patient Location: PACU  Anesthesia Type:General  Level of Consciousness: awake, alert , oriented and patient cooperative  Airway & Oxygen Therapy: Patient Spontanous Breathing and Patient connected to nasal cannula oxygen  Post-op Assessment: Report given to RN and Post -op Vital signs reviewed and stable  Post vital signs: Reviewed and stable  Last Vitals:  Vitals:   03/05/17 0759  BP: 138/76  Pulse: 72  Resp: 16  Temp: 36.6 C    Last Pain:  Vitals:   03/05/17 0759  TempSrc: Oral      Patients Stated Pain Goal: 7 (61/22/44 9753)  Complications: No apparent anesthesia complications

## 2017-03-08 ENCOUNTER — Encounter (HOSPITAL_BASED_OUTPATIENT_CLINIC_OR_DEPARTMENT_OTHER): Payer: Self-pay | Admitting: Urology

## 2017-04-07 DIAGNOSIS — N401 Enlarged prostate with lower urinary tract symptoms: Secondary | ICD-10-CM | POA: Diagnosis not present

## 2017-04-07 DIAGNOSIS — N132 Hydronephrosis with renal and ureteral calculous obstruction: Secondary | ICD-10-CM | POA: Diagnosis not present

## 2017-04-26 NOTE — Addendum Note (Signed)
Addendum  created 04/26/17 1204 by Myrtie Soman, MD   Sign clinical note

## 2017-07-19 DIAGNOSIS — I10 Essential (primary) hypertension: Secondary | ICD-10-CM | POA: Diagnosis not present

## 2017-07-28 ENCOUNTER — Encounter: Payer: Self-pay | Admitting: Cardiology

## 2017-08-11 DIAGNOSIS — I10 Essential (primary) hypertension: Secondary | ICD-10-CM | POA: Diagnosis not present

## 2017-08-11 DIAGNOSIS — K219 Gastro-esophageal reflux disease without esophagitis: Secondary | ICD-10-CM | POA: Diagnosis not present

## 2017-08-11 DIAGNOSIS — N400 Enlarged prostate without lower urinary tract symptoms: Secondary | ICD-10-CM | POA: Diagnosis not present

## 2017-08-11 DIAGNOSIS — Z0000 Encounter for general adult medical examination without abnormal findings: Secondary | ICD-10-CM | POA: Diagnosis not present

## 2017-08-11 DIAGNOSIS — Z1389 Encounter for screening for other disorder: Secondary | ICD-10-CM | POA: Diagnosis not present

## 2017-08-11 DIAGNOSIS — Z23 Encounter for immunization: Secondary | ICD-10-CM | POA: Diagnosis not present

## 2017-08-24 ENCOUNTER — Encounter: Payer: Self-pay | Admitting: Cardiology

## 2017-08-24 NOTE — Progress Notes (Signed)
Cardiology Office Note  Date: 08/25/2017   ID: James Mccall, DOB 03-29-1946, MRN [id number]  PCP: Sharilyn Sites, MD  Consulting Cardiologist: Rozann Lesches, MD   Chief Complaint  Patient presents with  . Chest Pain    History of Present Illness: James Mccall is a 71 y.o. male referred for cardiology consultation by Dr. Hilma Favors for the evaluation of fatigue and shortness of breath. He is here today with his wife. He states that he had a kidney stone and associated pyelonephritis back in March and April of this year. Since that time he has had less stamina, more short of breath with activities, more tired at the end of the day. He has a feeling of "stress" in his chest sometimes and takes an aspirin when this happens. 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 He states that he had a cardiac catheterization about 12 years ago with reassuring findings, results not available for review.  He also mentions that his blood pressure has been up and down. He is on Norvasc and blood pressure looks reasonable well controlled today. He has checked his home. He also takes Zocor for management of his lipids.  I personally reviewed his ECG today which shows sinus rhythm with nonspecific T-wave changes.  Past Medical History:  Diagnosis Date  . Arthritis   . 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 BPH with obstruction/lower urinary tract symptoms   . 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 GERD (gastroesophageal reflux disease)   . History of acute pyelonephritis 01/2017  . Hyperlipidemia   . Right ureteral stone   . Seasonal allergies     Past Surgical History:  Procedure Laterality Date  . CATARACT EXTRACTION W/PHACO Right 08/15/2015   Procedure: CATARACT EXTRACTION PHACO AND INTRAOCULAR LENS PLACEMENT (IOC);  Surgeon: Tonny Branch, MD;  [facility]: [facility];  Service: Ophthalmology;  Laterality: Right;  CDE:5.75  . CATARACT EXTRACTION W/PHACO Left  09/02/2015   Procedure: CATARACT EXTRACTION PHACO AND INTRAOCULAR LENS PLACEMENT LEFT EYE CDE=5.93;  Surgeon: Tonny Branch, MD;  Location: [facility];  Service: Ophthalmology;  Laterality: Left;  . COLONOSCOPY N/A 08/28/2013   Procedure: COLONOSCOPY;  Surgeon: Daneil Dolin, MD;  [facility];  Service: Endoscopy;  Laterality: N/A;  9:45  . CYSTOSCOPY W/ URETERAL STENT PLACEMENT Right 01/26/2017   Procedure: CYSTOSCOPY WITH RETROGRADE PYELOGRAM/URETERAL STENT PLACEMENT;  Surgeon: Irine Seal, MD;  [facility];  Service: Urology;  Laterality: Right;  . CYSTOSCOPY WITH URETEROSCOPY AND STENT PLACEMENT Right 03/05/2017   Procedure: cystoscopy with right ureteroscopy and stent exchange ;  Surgeon: Festus Aloe, MD;  Location: [facility];  Service: Urology;  Laterality: Right;  . NASAL SINUS SURGERY  2012  . ROTATOR CUFF REPAIR Bilateral 1998  . Vocal cord polyps  1990's   Benign    Current Outpatient Prescriptions  Medication Sig Dispense Refill  . amLODipine (NORVASC) 5 MG tablet Take 5 mg by mouth daily.     . diphenhydramine-acetaminophen (TYLENOL PM) 25-500 MG TABS Take 1-2 tablets by mouth at bedtime as needed (sleep).    Marland Kitchen esomeprazole (NEXIUM) 40 MG capsule Take 40 mg by mouth every morning.     Marland Kitchen ipratropium (ATROVENT) 0.03 % nasal spray Place 1 spray into both nostrils 2 (two) times daily as needed.     . meloxicam (MOBIC) 7.5 MG tablet Take 7.5 mg by mouth daily. Takes 2 tabs daily    .  montelukast (SINGULAIR) 10 MG tablet Take 10 mg by mouth every morning.     . simvastatin (ZOCOR) 20 MG tablet Take 20 mg by mouth every morning.     . tamsulosin (FLOMAX) 0.4 MG CAPS capsule Take 0.8 mg by mouth daily after breakfast.      No current facility-administered medications for this visit.    Allergies:  Patient has no known allergies.   Social History: The patient  reports that he quit smoking about 33 years ago. His smoking use included Cigarettes. He has a  22.50 pack-year smoking history. He has never used smokeless tobacco. He reports that he drinks alcohol. He reports that he does not use drugs.   Family History: The patient's family history includes Hyperlipidemia in his mother; Hypertension in his brother, mother, and sister; Stroke in his mother.   ROS:  Please see the history of present illness. Otherwise, complete review of systems is positive for none.  All other systems are reviewed and negative.   Physical Exam: VS:  BP 128/78 (BP Location: Right Arm)   Pulse 76   Ht 5\' 10"  (1.778 m)   Wt 194 lb (88 kg)   SpO2 95%   BMI 27.84 kg/m , BMI Body mass index is 27.84 kg/m.  Wt Readings from Last 3 Encounters:  08/25/17 194 lb (88 kg)  03/05/17 194 lb (88 kg)  02/16/17 193 lb (87.5 kg)    General: Well-developed elderly male, appears comfortable at rest. HEENT: Conjunctiva and lids normal, oropharynx clear. Neck: Supple, no elevated JVP or carotid bruits, no thyromegaly. Lungs: Clear to auscultation, nonlabored breathing at rest. Cardiac: Regular rate and rhythm, S4 with soft systolic murmur, no pericardial rub. Abdomen: Soft, nontender, bowel sounds present, no guarding or rebound. Extremities: No pitting edema, distal pulses 2+. Skin: Warm and dry. Musculoskeletal: No kyphosis. Neuropsychiatric: Alert and oriented x3, affect grossly appropriate.  ECG: I personally reviewed the tracing from 08/13/2015 which showed sinus rhythm with increased voltage and inferior Q waves.  Recent Labwork: 01/25/2017: ALT 9; AST 22 01/28/2017: BUN 19; Creatinine, Ser 1.20; Potassium 4.2; Sodium 140 02/16/2017: Hemoglobin 11.6; Platelets 221   Assessment and Plan:  1. Shortness of breath and exertional fatigue as discussed above. ECG is nonspecific. Cardiac risk factors include age and gender, hypertension, and hyperlipidemia. Symptoms more noticeable over the last few months since bout of pyelonephritis. He does not report any fevers or chills, no  abdominal pain or flank pain. Plan is to proceed with follow-up ischemic evaluation and structural testing including exercise Myoview and echocardiogram.  2. Essential hypertension, continues on Norvasc. Discussed sodium restriction. Keep check on blood pressure at home. Follow-up with Dr. Hilma Favors.  3. Hyperlipidemia, on Zocor. He follows with Dr. Hilma Favors.  4. Tobacco abuse in remission.  Current medicines were reviewed with the patient today.   Orders Placed This Encounter  Procedures  . NM Myocar Multi W/Spect W/Wall Motion / EF  . EKG 12-Lead  . ECHOCARDIOGRAM COMPLETE    Disposition: Call with test results.  Signed, Satira Sark, MD, [facility] 08/25/2017 12:00 PM    [facility] [place]. [address], [place], [place] [address] Phone: [phone]; Fax: [phone]

## 2017-08-25 ENCOUNTER — Ambulatory Visit (INDEPENDENT_AMBULATORY_CARE_PROVIDER_SITE_OTHER): Payer: Medicare Other | Admitting: Cardiology

## 2017-08-25 ENCOUNTER — Encounter: Payer: Self-pay | Admitting: Cardiology

## 2017-08-25 VITALS — BP 128/78 | HR 76 | Ht 70.0 in | Wt 194.0 lb

## 2017-08-25 DIAGNOSIS — E782 Mixed hyperlipidemia: Secondary | ICD-10-CM | POA: Diagnosis not present

## 2017-08-25 DIAGNOSIS — F17201 Nicotine dependence, unspecified, in remission: Secondary | ICD-10-CM

## 2017-08-25 DIAGNOSIS — R5383 Other fatigue: Secondary | ICD-10-CM

## 2017-08-25 DIAGNOSIS — I10 Essential (primary) hypertension: Secondary | ICD-10-CM | POA: Diagnosis not present

## 2017-08-25 DIAGNOSIS — R0609 Other forms of dyspnea: Secondary | ICD-10-CM

## 2017-08-25 DIAGNOSIS — R0602 Shortness of breath: Secondary | ICD-10-CM | POA: Diagnosis not present

## 2017-08-25 NOTE — Patient Instructions (Signed)
Your physician recommends that you schedule a follow-up appointment in: we will call you with results.    Your physician has requested that you have an echocardiogram. Echocardiography is a painless test that uses sound waves to create images of your heart. It provides your doctor with information about the size and shape of your heart and how well your heart's chambers and valves are working. This procedure takes approximately one hour. There are no restrictions for this procedure.     Your physician has requested that you have en exercise stress myoview. For further information please visit [email or web address]. Please follow instruction sheet, as given.     Your physician recommends that you continue on your current medications as directed. Please refer to the Current Medication list given to you today.        Thank you for choosing [facility] !

## 2017-08-31 ENCOUNTER — Encounter (HOSPITAL_BASED_OUTPATIENT_CLINIC_OR_DEPARTMENT_OTHER)
Admission: RE | Admit: 2017-08-31 | Discharge: 2017-08-31 | Disposition: A | Discharge status: Home / Self Care | Payer: Medicare Other | Source: Ambulatory Visit | Attending: Cardiology | Admitting: Cardiology

## 2017-08-31 ENCOUNTER — Encounter (HOSPITAL_COMMUNITY)
Admission: RE | Admit: 2017-08-31 | Discharge: 2017-08-31 | Disposition: A | Discharge status: Home / Self Care | Payer: Medicare Other | Source: Ambulatory Visit | Attending: Cardiology | Admitting: Cardiology

## 2017-08-31 ENCOUNTER — Ambulatory Visit (HOSPITAL_BASED_OUTPATIENT_CLINIC_OR_DEPARTMENT_OTHER)
Admission: RE | Admit: 2017-08-31 | Discharge: 2017-08-31 | Disposition: A | Discharge status: Home / Self Care | Payer: Medicare Other | Source: Ambulatory Visit | Attending: Cardiology | Admitting: Cardiology

## 2017-08-31 ENCOUNTER — Encounter (HOSPITAL_COMMUNITY): Payer: Self-pay

## 2017-08-31 DIAGNOSIS — I119 Hypertensive heart disease without heart failure: Secondary | ICD-10-CM

## 2017-08-31 DIAGNOSIS — K219 Gastro-esophageal reflux disease without esophagitis: Secondary | ICD-10-CM | POA: Insufficient documentation

## 2017-08-31 DIAGNOSIS — R0602 Shortness of breath: Secondary | ICD-10-CM | POA: Insufficient documentation

## 2017-08-31 DIAGNOSIS — I351 Nonrheumatic aortic (valve) insufficiency: Secondary | ICD-10-CM

## 2017-08-31 DIAGNOSIS — Z87891 Personal history of nicotine dependence: Secondary | ICD-10-CM | POA: Insufficient documentation

## 2017-08-31 DIAGNOSIS — E785 Hyperlipidemia, unspecified: Secondary | ICD-10-CM

## 2017-08-31 DIAGNOSIS — R5383 Other fatigue: Secondary | ICD-10-CM | POA: Diagnosis not present

## 2017-08-31 LAB — NM MYOCAR MULTI W/SPECT W/WALL MOTION / EF
CHL CUP NUCLEAR SRS: 2
CHL CUP RESTING HR STRESS: 60 {beats}/min
CHL RATE OF PERCEIVED EXERTION: 12
CSEPEDS: 0 s
Estimated workload: 7 METS
Exercise duration (min): 6 min
LVDIAVOL: 152 mL (ref 62–150)
LVSYSVOL: 87 mL
MPHR: 148 {beats}/min
Peak HR: 134 {beats}/min
Percent HR: 90 %
RATE: 0.41
SDS: 2
SSS: 4
TID: 0.99

## 2017-08-31 MED ORDER — TECHNETIUM TC 99M TETROFOSMIN IV KIT
30.0000 | PACK | Freq: Once | INTRAVENOUS | Status: AC | PRN
  Administered 2017-08-31: 30 via INTRAVENOUS

## 2017-08-31 MED ORDER — SODIUM CHLORIDE 0.9% FLUSH
INTRAVENOUS | Status: AC
  Filled 2017-08-31: qty 180

## 2017-08-31 MED ORDER — TECHNETIUM TC 99M TETROFOSMIN IV KIT
10.0000 | PACK | Freq: Once | INTRAVENOUS | Status: AC | PRN
  Administered 2017-08-31: 10 via INTRAVENOUS

## 2017-08-31 MED ORDER — REGADENOSON 0.4 MG/5ML IV SOLN
INTRAVENOUS | Status: AC
  Filled 2017-08-31: qty 5

## 2017-08-31 MED ORDER — SODIUM CHLORIDE 0.9% FLUSH
INTRAVENOUS | Status: AC
  Administered 2017-08-31: 10 mL via INTRAVENOUS
  Filled 2017-08-31: qty 10

## 2017-08-31 NOTE — Progress Notes (Signed)
*  PRELIMINARY RESULTS* Echocardiogram 2D Echocardiogram has been performed.  Leavy Cella 08/31/2017, 11:55 AM

## 2017-09-28 ENCOUNTER — Encounter: Payer: Self-pay | Admitting: Cardiology

## 2017-09-28 ENCOUNTER — Ambulatory Visit: Payer: Medicare Other | Admitting: Cardiology

## 2017-09-28 VITALS — BP 126/68 | HR 80 | Ht 70.0 in | Wt 198.0 lb

## 2017-09-28 DIAGNOSIS — R9439 Abnormal result of other cardiovascular function study: Secondary | ICD-10-CM

## 2017-09-28 DIAGNOSIS — I10 Essential (primary) hypertension: Secondary | ICD-10-CM

## 2017-09-28 DIAGNOSIS — R0609 Other forms of dyspnea: Secondary | ICD-10-CM | POA: Diagnosis not present

## 2017-09-28 DIAGNOSIS — E782 Mixed hyperlipidemia: Secondary | ICD-10-CM | POA: Diagnosis not present

## 2017-09-28 NOTE — Patient Instructions (Signed)
Your physician recommends that you schedule a follow-up appointment in:  Early December with Dr.McDowell   START Enteric coated Aspirin 81 mg daily    No other med changes    No lab work or tests ordered today.      Thank you for choosing [facility] !

## 2017-09-28 NOTE — H&P (View-Only) (Signed)
Cardiology Office Note  Date: 09/28/2017   ID: James Mccall, DOB 1946/01/01, MRN [id number]  PCP: Sharilyn Sites, MD  Primary Cardiologist: Rozann Lesches, MD   Chief Complaint  Patient presents with  . Follow-up cardiac testing    History of Present Illness: James Mccall is a 71 y.o. male seen in consultation in October for evaluation of exertional fatigue and shortness of breath. He was referred for cardiac testing including an echocardiogram and Myoview.  He presents today with his wife for a follow-up visit.  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  He does not report angina but still has a general sense that his stamina is not what it should be.  Echocardiogram reported LVEF 45-50% with normal diastolic parameters, diffuse hypokinesis most prominently noted in the inferior and posterolateral walls. Myoview showed hypertensive response to exercise with LVEF 42% and inferior wall "thinning."  I discussed the results with him today.  Although no large ischemic territories were identified, he still could have underlying CAD based on these results.  We discussed the possibility of a cardiac catheterization for further assessment.  He states that he would like to think about this further, but will likely plan on proceeding sometime in early December.  His blood pressure is well controlled today.  Current medications include aspirin, Norvasc, and Zocor.  Past Medical History:  Diagnosis Date  . Arthritis   . 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 BPH with obstruction/lower urinary tract symptoms   . 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 GERD (gastroesophageal reflux disease)   . History of acute pyelonephritis 01/2017  . Hyperlipidemia   . Right ureteral stone   . Seasonal allergies     Past Surgical History:  Procedure Laterality Date  . NASAL SINUS SURGERY  2012  . ROTATOR  CUFF REPAIR Bilateral 1998  . Vocal cord polyps  1990's   Benign    Current Outpatient Medications  Medication Sig Dispense Refill  . amLODipine (NORVASC) 5 MG tablet Take 5 mg by mouth daily.     Marland Kitchen aspirin EC 81 MG tablet Take 81 mg daily by mouth.    . diphenhydramine-acetaminophen (TYLENOL PM) 25-500 MG TABS Take 1-2 tablets by mouth at bedtime as needed (sleep).    Marland Kitchen esomeprazole (NEXIUM) 40 MG capsule Take 40 mg by mouth every morning.     Marland Kitchen ipratropium (ATROVENT) 0.03 % nasal spray Place 1 spray into both nostrils 2 (two) times daily as needed.     . meloxicam (MOBIC) 7.5 MG tablet Take 7.5 mg by mouth daily. Takes 2 tabs daily    . montelukast (SINGULAIR) 10 MG tablet Take 10 mg by mouth every morning.     . simvastatin (ZOCOR) 20 MG tablet Take 20 mg by mouth every morning.     . tamsulosin (FLOMAX) 0.4 MG CAPS capsule Take 0.8 mg by mouth daily after breakfast.      No current facility-administered medications for this visit.    Allergies:  Patient has no known allergies.   Social History: The patient  reports that he quit smoking about 33 years ago. His smoking use included cigarettes. He has a 22.50 pack-year smoking history. he has never used smokeless tobacco. He reports that he drinks alcohol. He reports that he does not use drugs.   ROS:  Please see the history of present  illness. Otherwise, complete review of systems is positive for none.  All other systems are reviewed and negative.   Physical Exam: VS:  BP 126/68   Pulse 80   Ht 5\' 10"  (1.778 m)   Wt 198 lb (89.8 kg)   SpO2 95%   BMI 28.41 kg/m , BMI Body mass index is 28.41 kg/m.  Wt Readings from Last 3 Encounters:  09/28/17 198 lb (89.8 kg)  08/25/17 194 lb (88 kg)  03/05/17 194 lb (88 kg)    General: Patient appears comfortable at rest. HEENT: Conjunctiva and lids normal, oropharynx clear with moist mucosa. Neck: Supple, no elevated JVP or carotid bruits, no thyromegaly. Lungs: Clear to auscultation,  nonlabored breathing at rest. Cardiac: Regular rate and rhythm, S4, soft systolic murmur, no pericardial rub. Abdomen: Soft, nontender, bowel sounds present, no guarding or rebound. Extremities: No pitting edema, distal pulses 2+. Skin: Warm and dry. Musculoskeletal: No kyphosis. Neuropsychiatric: Alert and oriented x3, affect grossly appropriate.  ECG: I personally reviewed the tracing from 08/25/2017 which showed sinus rhythm with nonspecific ST-T changes.  Recent Labwork: 01/25/2017: ALT 9; AST 22 01/28/2017: BUN 19; Creatinine, Ser 1.20; Potassium 4.2; Sodium 140 02/16/2017: Hemoglobin 11.6; Platelets 221   Other Studies Reviewed Today:  Exercise Myoview 08/31/2017:  Blood pressure demonstrated a hypertensive response to exercise.  This is an intermediate risk study.  The left ventricular ejection fraction is moderately decreased (30-44%).  There was no ST segment deviation noted during stress.   Thinning of the inferior wall not thought to be significant. LV appears dilated with  Diffuse hypokinesis EF estimated 42% suggest echo correlation  Intermediate risk based on EF   Echocardiogram 08/31/2017: Study Conclusions  - Left ventricle: Diffuse hypokinesis worse in the inferior and   posterior lateral walls. The cavity size was mildly dilated.   There was mild concentric hypertrophy. Systolic function was   mildly reduced. The estimated ejection fraction was in the range   of 45% to 50%. Left ventricular diastolic function parameters   were normal. - Aortic valve: There was mild regurgitation. - Left atrium: The atrium was mildly dilated. - Pulmonary arteries: PA peak pressure: 36 mm Hg (S).  Assessment and Plan:  1.  Generalized exertional fatigue and decreased stamina, mainly noted over the last 6 months.  Recent cardiac testing showed evidence of mild LV dysfunction and potentially inferior scar although no large ischemic territories.  We have discussed implication of  these findings as relates to possible underlying CAD and we went over the risks and benefits of a diagnostic cardiac catheterization for clarification.  He would like to think about this for the next few weeks, but plans to consider proceeding sometime in early December.  Continue with present medical therapy.  2.  Essential hypertension, blood pressure is adequately controlled today on Norvasc.  3.  Hyperlipidemia, continues on Zocor.  Current medicines were reviewed with the patient today.  Disposition: Follow-up in early December.  Signed, Satira Sark, MD, [facility] 09/28/2017 2:06 PM    [facility] [address] [place], [place] [address] Phone: [phone]; Fax: [phone]

## 2017-09-28 NOTE — Progress Notes (Signed)
Cardiology Office Note  Date: 09/28/2017   ID: James Mccall, DOB [birth year], MRN [id number]  PCP: James Sites, MD  Primary Cardiologist: James Lesches, MD   Chief Complaint  Patient presents with  . Follow-up cardiac testing    History of Present Illness: James Mccall is a 71 y.o. male seen in consultation in October for evaluation of exertional fatigue and shortness of breath. He was referred for cardiac testing including an echocardiogram and Myoview.  He presents today with his wife for a follow-up visit.  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  He does not report angina but still has a general sense that his stamina is not what it should be.  Echocardiogram reported LVEF 45-50% with normal diastolic parameters, diffuse hypokinesis most prominently noted in the inferior and posterolateral walls. Myoview showed hypertensive response to exercise with LVEF 42% and inferior wall "thinning."  I discussed the results with him today.  Although no large ischemic territories were identified, he still could have underlying CAD based on these results.  We discussed the possibility of a cardiac catheterization for further assessment.  He states that he would like to think about this further, but will likely plan on proceeding sometime in early December.  His blood pressure is well controlled today.  Current medications include aspirin, Norvasc, and Zocor.  Past Medical History:  Diagnosis Date  . Arthritis   . 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 BPH with obstruction/lower urinary tract symptoms   . 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 GERD (gastroesophageal reflux disease)   . History of acute pyelonephritis 01/2017  . Hyperlipidemia   . Right ureteral stone   . Seasonal allergies     Past Surgical History:  Procedure Laterality Date  . NASAL SINUS SURGERY  2012  . ROTATOR  CUFF REPAIR Bilateral 1998  . Vocal cord polyps  1990's   Benign    Current Outpatient Medications  Medication Sig Dispense Refill  . amLODipine (NORVASC) 5 MG tablet Take 5 mg by mouth daily.     Marland Kitchen aspirin EC 81 MG tablet Take 81 mg daily by mouth.    . diphenhydramine-acetaminophen (TYLENOL PM) 25-500 MG TABS Take 1-2 tablets by mouth at bedtime as needed (sleep).    Marland Kitchen esomeprazole (NEXIUM) 40 MG capsule Take 40 mg by mouth every morning.     Marland Kitchen ipratropium (ATROVENT) 0.03 % nasal spray Place 1 spray into both nostrils 2 (two) times daily as needed.     . meloxicam (MOBIC) 7.5 MG tablet Take 7.5 mg by mouth daily. Takes 2 tabs daily    . montelukast (SINGULAIR) 10 MG tablet Take 10 mg by mouth every morning.     . simvastatin (ZOCOR) 20 MG tablet Take 20 mg by mouth every morning.     . tamsulosin (FLOMAX) 0.4 MG CAPS capsule Take 0.8 mg by mouth daily after breakfast.      No current facility-administered medications for this visit.    Allergies:  Patient has no known allergies.   Social History: The patient  reports that he quit smoking about 33 years ago. His smoking use included cigarettes. He has a 22.50 pack-year smoking history. he has never used smokeless tobacco. He reports that he drinks alcohol. He reports that he does not use drugs.   ROS:  Please see the history of present  illness. Otherwise, complete review of systems is positive for none.  All other systems are reviewed and negative.   Physical Exam: VS:  BP 126/68   Pulse 80   Ht 5\' 10"  (1.778 m)   Wt 198 lb (89.8 kg)   SpO2 95%   BMI 28.41 kg/m , BMI Body mass index is 28.41 kg/m.  Wt Readings from Last 3 Encounters:  09/28/17 198 lb (89.8 kg)  08/25/17 194 lb (88 kg)  03/05/17 194 lb (88 kg)    General: Patient appears comfortable at rest. HEENT: Conjunctiva and lids normal, oropharynx clear with moist mucosa. Neck: Supple, no elevated JVP or carotid bruits, no thyromegaly. Lungs: Clear to auscultation,  nonlabored breathing at rest. Cardiac: Regular rate and rhythm, S4, soft systolic murmur, no pericardial rub. Abdomen: Soft, nontender, bowel sounds present, no guarding or rebound. Extremities: No pitting edema, distal pulses 2+. Skin: Warm and dry. Musculoskeletal: No kyphosis. Neuropsychiatric: Alert and oriented x3, affect grossly appropriate.  ECG: I personally reviewed the tracing from 08/25/2017 which showed sinus rhythm with nonspecific ST-T changes.  Recent Labwork: 01/25/2017: ALT 9; AST 22 01/28/2017: BUN 19; Creatinine, Ser 1.20; Potassium 4.2; Sodium 140 02/16/2017: Hemoglobin 11.6; Platelets 221   Other Studies Reviewed Today:  Exercise Myoview 08/31/2017:  Blood pressure demonstrated a hypertensive response to exercise.  This is an intermediate risk study.  The left ventricular ejection fraction is moderately decreased (30-44%).  There was no ST segment deviation noted during stress.   Thinning of the inferior wall not thought to be significant. LV appears dilated with  Diffuse hypokinesis EF estimated 42% suggest echo correlation  Intermediate risk based on EF   Echocardiogram 08/31/2017: Study Conclusions  - Left ventricle: Diffuse hypokinesis worse in the inferior and   posterior lateral walls. The cavity size was mildly dilated.   There was mild concentric hypertrophy. Systolic function was   mildly reduced. The estimated ejection fraction was in the range   of 45% to 50%. Left ventricular diastolic function parameters   were normal. - Aortic valve: There was mild regurgitation. - Left atrium: The atrium was mildly dilated. - Pulmonary arteries: PA peak pressure: 36 mm Hg (S).  Assessment and Plan:  1.  Generalized exertional fatigue and decreased stamina, mainly noted over the last 6 months.  Recent cardiac testing showed evidence of mild LV dysfunction and potentially inferior scar although no large ischemic territories.  We have discussed implication of  these findings as relates to possible underlying CAD and we went over the risks and benefits of a diagnostic cardiac catheterization for clarification.  He would like to think about this for the next few weeks, but plans to consider proceeding sometime in early December.  Continue with present medical therapy.  2.  Essential hypertension, blood pressure is adequately controlled today on Norvasc.  3.  Hyperlipidemia, continues on Zocor.  Current medicines were reviewed with the patient today.  Disposition: Follow-up in early December.  Signed, Satira Sark, MD, [facility] 09/28/2017 2:06 PM    [facility] at [facility] [address] [place], [place] [address] Phone: [phone]; Fax: [phone]

## 2017-10-19 ENCOUNTER — Other Ambulatory Visit: Payer: Self-pay | Admitting: Cardiology

## 2017-10-19 ENCOUNTER — Other Ambulatory Visit (HOSPITAL_COMMUNITY)
Admission: RE | Admit: 2017-10-19 | Discharge: 2017-10-19 | Disposition: A | Discharge status: Home / Self Care | Payer: Medicare Other | Source: Ambulatory Visit | Attending: Cardiology | Admitting: Cardiology

## 2017-10-19 ENCOUNTER — Ambulatory Visit (HOSPITAL_COMMUNITY)
Admission: RE | Admit: 2017-10-19 | Discharge: 2017-10-19 | Disposition: A | Discharge status: Home / Self Care | Payer: Medicare Other | Source: Ambulatory Visit | Attending: Cardiology | Admitting: Cardiology

## 2017-10-19 ENCOUNTER — Telehealth: Payer: Self-pay | Admitting: Cardiology

## 2017-10-19 DIAGNOSIS — Z7982 Long term (current) use of aspirin: Secondary | ICD-10-CM | POA: Insufficient documentation

## 2017-10-19 DIAGNOSIS — E785 Hyperlipidemia, unspecified: Secondary | ICD-10-CM | POA: Insufficient documentation

## 2017-10-19 DIAGNOSIS — R9439 Abnormal result of other cardiovascular function study: Secondary | ICD-10-CM | POA: Diagnosis not present

## 2017-10-19 DIAGNOSIS — Z79899 Other long term (current) drug therapy: Secondary | ICD-10-CM | POA: Diagnosis not present

## 2017-10-19 DIAGNOSIS — Z01818 Encounter for other preprocedural examination: Secondary | ICD-10-CM | POA: Diagnosis not present

## 2017-10-19 DIAGNOSIS — Z452 Encounter for adjustment and management of vascular access device: Secondary | ICD-10-CM | POA: Diagnosis not present

## 2017-10-19 DIAGNOSIS — K219 Gastro-esophageal reflux disease without esophagitis: Secondary | ICD-10-CM | POA: Insufficient documentation

## 2017-10-19 LAB — BASIC METABOLIC PANEL
ANION GAP: 6 (ref 5–15)
BUN: 17 mg/dL (ref 6–20)
CALCIUM: 9.2 mg/dL (ref 8.9–10.3)
CO2: 23 mmol/L (ref 22–32)
Chloride: 107 mmol/L (ref 101–111)
Creatinine, Ser: 1.09 mg/dL (ref 0.61–1.24)
Glucose, Bld: 108 mg/dL — ABNORMAL HIGH (ref 65–99)
POTASSIUM: 3.9 mmol/L (ref 3.5–5.1)
SODIUM: 136 mmol/L (ref 135–145)

## 2017-10-19 LAB — CBC WITH DIFFERENTIAL/PLATELET
BASOS ABS: 0 10*3/uL (ref 0.0–0.1)
Basophils Relative: 0 %
EOS ABS: 0.1 10*3/uL (ref 0.0–0.7)
EOS PCT: 1 %
HCT: 37.5 % — ABNORMAL LOW (ref 39.0–52.0)
Hemoglobin: 12.9 g/dL — ABNORMAL LOW (ref 13.0–17.0)
LYMPHS PCT: 41 %
Lymphs Abs: 2.2 10*3/uL (ref 0.7–4.0)
MCH: 27.4 pg (ref 26.0–34.0)
MCHC: 34.4 g/dL (ref 30.0–36.0)
MCV: 79.8 fL (ref 78.0–100.0)
Monocytes Absolute: 0.6 10*3/uL (ref 0.1–1.0)
Monocytes Relative: 10 %
Neutro Abs: 2.6 10*3/uL (ref 1.7–7.7)
Neutrophils Relative %: 48 %
PLATELETS: 222 10*3/uL (ref 150–400)
RBC: 4.7 MIL/uL (ref 4.22–5.81)
RDW: 17.2 % — AB (ref 11.5–15.5)
WBC: 5.4 10*3/uL (ref 4.0–10.5)

## 2017-10-19 LAB — PROTIME-INR
INR: 0.95
PROTHROMBIN TIME: 12.6 s (ref 11.4–15.2)

## 2017-10-19 NOTE — Telephone Encounter (Signed)
-----   Message from Satira Sark, MD sent at 10/19/2017 12:07 PM EST ----- Regarding: RE: pt wants to proceed with cath Left and right heart catheterization.  No specific MD, would go with convenient time for patient and family.  Please let me know the details, I will need to amend my recent office note.  ----- Message ----- From: Bernita Raisin, RN Sent: 10/19/2017  11:20 AM To: Satira Sark, MD Subject: pt wants to proceed with cath                  Is this a left heart ? Any particle MD for the cath ? thanks

## 2017-10-19 NOTE — Telephone Encounter (Signed)
Would like to speak w/ someone about setting up his Cath

## 2017-10-19 NOTE — Progress Notes (Signed)
Patient called back to the office on November 27 indicating that he wanted to proceed with cardiac catheterization, this was discussed at his office visit on November 6.  He has been experiencing exertional fatigue and decreased stamina over the last 6 months with noninvasive cardiac imaging demonstrating LVEF 45-50% and evidence of potential inferior wall scar suggesting underlying CAD.  Risks and benefits already discussed.  Orders were placed for a right and left heart catheterization with Dr. Burt Knack on December 4 at 10:30 in the morning.  The preprocedure lab work and chest x-ray will be obtained.

## 2017-10-19 NOTE — Telephone Encounter (Signed)
R/L heart cath scheduled for 10/26/17 at 1030 hrs with Dr Burt Knack.Patient to have labs and cxr today     Follow up apt on 11/01/17 at 3 pm with [facility] DNP

## 2017-10-25 ENCOUNTER — Telehealth: Payer: Self-pay

## 2017-10-25 NOTE — Telephone Encounter (Signed)
No DPR on file.  Left generic message requesting call back.  This nurse name and # left for call back.

## 2017-10-26 ENCOUNTER — Encounter (HOSPITAL_COMMUNITY)
Admission: RE | Disposition: A | Discharge status: Home / Self Care | Payer: Self-pay | Source: Ambulatory Visit | Attending: Cardiovascular Disease

## 2017-10-26 ENCOUNTER — Encounter (HOSPITAL_COMMUNITY): Payer: Self-pay | Admitting: Cardiovascular Disease

## 2017-10-26 ENCOUNTER — Ambulatory Visit (HOSPITAL_COMMUNITY)
Admission: RE | Admit: 2017-10-26 | Discharge: 2017-10-26 | Disposition: A | Discharge status: Home / Self Care | Payer: Medicare Other | Source: Ambulatory Visit | Attending: Cardiovascular Disease | Admitting: Cardiovascular Disease

## 2017-10-26 DIAGNOSIS — R5383 Other fatigue: Secondary | ICD-10-CM | POA: Diagnosis not present

## 2017-10-26 DIAGNOSIS — D563 Thalassemia minor: Secondary | ICD-10-CM | POA: Diagnosis not present

## 2017-10-26 DIAGNOSIS — R0602 Shortness of breath: Secondary | ICD-10-CM

## 2017-10-26 DIAGNOSIS — E785 Hyperlipidemia, unspecified: Secondary | ICD-10-CM | POA: Diagnosis not present

## 2017-10-26 DIAGNOSIS — R9439 Abnormal result of other cardiovascular function study: Secondary | ICD-10-CM

## 2017-10-26 DIAGNOSIS — I10 Essential (primary) hypertension: Secondary | ICD-10-CM | POA: Insufficient documentation

## 2017-10-26 DIAGNOSIS — I428 Other cardiomyopathies: Secondary | ICD-10-CM | POA: Diagnosis not present

## 2017-10-26 DIAGNOSIS — M1990 Unspecified osteoarthritis, unspecified site: Secondary | ICD-10-CM | POA: Insufficient documentation

## 2017-10-26 DIAGNOSIS — K219 Gastro-esophageal reflux disease without esophagitis: Secondary | ICD-10-CM | POA: Insufficient documentation

## 2017-10-26 DIAGNOSIS — Z87891 Personal history of nicotine dependence: Secondary | ICD-10-CM | POA: Diagnosis not present

## 2017-10-26 DIAGNOSIS — Z7982 Long term (current) use of aspirin: Secondary | ICD-10-CM | POA: Insufficient documentation

## 2017-10-26 HISTORY — PX: RIGHT/LEFT HEART CATH AND CORONARY ANGIOGRAPHY: CATH118266

## 2017-10-26 LAB — POCT I-STAT 3, ART BLOOD GAS (G3+)
Acid-base deficit: 1 mmol/L (ref 0.0–2.0)
Bicarbonate: 25 mmol/L (ref 20.0–28.0)
O2 SAT: 99 %
PCO2 ART: 46.5 mmHg (ref 32.0–48.0)
PH ART: 7.339 — AB (ref 7.350–7.450)
PO2 ART: 141 mmHg — AB (ref 83.0–108.0)
TCO2: 26 mmol/L (ref 22–32)

## 2017-10-26 LAB — POCT I-STAT 3, VENOUS BLOOD GAS (G3P V)
ACID-BASE DEFICIT: 2 mmol/L (ref 0.0–2.0)
Bicarbonate: 25.3 mmol/L (ref 20.0–28.0)
O2 Saturation: 76 %
PH VEN: 7.304 (ref 7.250–7.430)
TCO2: 27 mmol/L (ref 22–32)
pCO2, Ven: 51 mmHg (ref 44.0–60.0)
pO2, Ven: 45 mmHg (ref 32.0–45.0)

## 2017-10-26 SURGERY — RIGHT/LEFT HEART CATH AND CORONARY ANGIOGRAPHY
Anesthesia: LOCAL

## 2017-10-26 MED ORDER — ASPIRIN 81 MG PO CHEW: 81.0000 mg | CHEWABLE_TABLET | ORAL | Status: DC

## 2017-10-26 MED ORDER — FENTANYL CITRATE (PF) 100 MCG/2ML IJ SOLN
INTRAMUSCULAR | Status: DC | PRN
  Administered 2017-10-26: 50 ug via INTRAVENOUS

## 2017-10-26 MED ORDER — VERAPAMIL HCL 2.5 MG/ML IV SOLN
INTRAVENOUS | Status: DC | PRN
  Administered 2017-10-26: 10:00:00 via INTRA_ARTERIAL

## 2017-10-26 MED ORDER — MIDAZOLAM HCL 2 MG/2ML IJ SOLN
INTRAMUSCULAR | Status: DC | PRN
  Administered 2017-10-26: 2 mg via INTRAVENOUS

## 2017-10-26 MED ORDER — SODIUM CHLORIDE 0.9 % IV SOLN: INTRAVENOUS | Status: AC

## 2017-10-26 MED ORDER — SODIUM CHLORIDE 0.9 % WEIGHT BASED INFUSION: 1.0000 mL/kg/h | INTRAVENOUS | Status: DC

## 2017-10-26 MED ORDER — HEPARIN SODIUM (PORCINE) 1000 UNIT/ML IJ SOLN
INTRAMUSCULAR | Status: AC
  Filled 2017-10-26: qty 1

## 2017-10-26 MED ORDER — LIDOCAINE HCL (PF) 1 % IJ SOLN
INTRAMUSCULAR | Status: DC | PRN
Start: 2017-10-26 — End: 2017-10-26
  Administered 2017-10-26 (×2): 2 mL via INTRADERMAL

## 2017-10-26 MED ORDER — MIDAZOLAM HCL 2 MG/2ML IJ SOLN
INTRAMUSCULAR | Status: AC
  Filled 2017-10-26: qty 2

## 2017-10-26 MED ORDER — LIDOCAINE HCL (PF) 1 % IJ SOLN
INTRAMUSCULAR | Status: AC
  Filled 2017-10-26: qty 30

## 2017-10-26 MED ORDER — SODIUM CHLORIDE 0.9 % IV SOLN: 250.0000 mL | INTRAVENOUS | Status: DC | PRN

## 2017-10-26 MED ORDER — IOPAMIDOL (ISOVUE-370) INJECTION 76%
INTRAVENOUS | Status: AC
  Filled 2017-10-26: qty 100

## 2017-10-26 MED ORDER — VERAPAMIL HCL 2.5 MG/ML IV SOLN
INTRAVENOUS | Status: AC
  Filled 2017-10-26: qty 2

## 2017-10-26 MED ORDER — HEPARIN (PORCINE) IN NACL 2-0.9 UNIT/ML-% IJ SOLN
INTRAMUSCULAR | Status: AC | PRN
  Administered 2017-10-26: 1000 mL via INTRA_ARTERIAL

## 2017-10-26 MED ORDER — SODIUM CHLORIDE 0.9 % IV SOLN
250.0000 mL | INTRAVENOUS | Status: DC | PRN
Start: 2017-10-26 — End: 2017-10-26

## 2017-10-26 MED ORDER — SODIUM CHLORIDE 0.9% FLUSH: 3.0000 mL | INTRAVENOUS | Status: DC | PRN

## 2017-10-26 MED ORDER — FENTANYL CITRATE (PF) 100 MCG/2ML IJ SOLN
INTRAMUSCULAR | Status: AC
  Filled 2017-10-26: qty 2

## 2017-10-26 MED ORDER — IOPAMIDOL (ISOVUE-370) INJECTION 76%
INTRAVENOUS | Status: DC | PRN
  Administered 2017-10-26: 50 mL via INTRA_ARTERIAL

## 2017-10-26 MED ORDER — SODIUM CHLORIDE 0.9% FLUSH: 3.0000 mL | Freq: Two times a day (BID) | INTRAVENOUS | Status: DC

## 2017-10-26 MED ORDER — SODIUM CHLORIDE 0.9 % WEIGHT BASED INFUSION: 3.0000 mL/kg/h | INTRAVENOUS | Status: AC

## 2017-10-26 MED ORDER — HEPARIN (PORCINE) IN NACL 2-0.9 UNIT/ML-% IJ SOLN
INTRAMUSCULAR | Status: AC
  Filled 2017-10-26: qty 1000

## 2017-10-26 MED ORDER — HEPARIN SODIUM (PORCINE) 1000 UNIT/ML IJ SOLN
INTRAMUSCULAR | Status: DC | PRN
Start: 2017-10-26 — End: 2017-10-26
  Administered 2017-10-26: 4500 [IU] via INTRAVENOUS

## 2017-10-26 SURGICAL SUPPLY — 11 items
CATH BALLN WEDGE 5F 110CM (CATHETERS) ×2 IMPLANT
CATH INFINITI 5FR MULTPACK ANG (CATHETERS) ×2 IMPLANT
DEVICE RAD COMP TR BAND LRG (VASCULAR PRODUCTS) ×2 IMPLANT
GLIDESHEATH SLEND SS 6F .021 (SHEATH) ×2 IMPLANT
GUIDEWIRE INQWIRE 1.5J.035X260 (WIRE) ×1 IMPLANT
INQWIRE 1.5J .035X260CM (WIRE) ×2
KIT HEART LEFT (KITS) ×2 IMPLANT
PACK CARDIAC CATHETERIZATION (CUSTOM PROCEDURE TRAY) ×2 IMPLANT
SHEATH GLIDE SLENDER 4/5FR (SHEATH) ×2 IMPLANT
TRANSDUCER W/STOPCOCK (MISCELLANEOUS) ×2 IMPLANT
TUBING CIL FLEX 10 FLL-RA (TUBING) ×2 IMPLANT

## 2017-10-26 NOTE — Discharge Instructions (Signed)

## 2017-10-26 NOTE — Interval H&P Note (Signed)
History and Physical Interval Note:  10/26/2017 8:58 AM  James Mccall  has presented today for cardiac cath with the diagnosis of dyspnea, abnormal stress test. The various methods of treatment have been discussed with the patient and family. After consideration of risks, benefits and other options for treatment, the patient has consented to  Procedure(s): RIGHT/LEFT HEART CATH AND CORONARY ANGIOGRAPHY (N/A) as a surgical intervention .  The patient's history has been reviewed, patient examined, no change in status, stable for surgery.  I have reviewed the patient's chart and labs.  Questions were answered to the patient's satisfaction.    Cath Lab Visit (complete for each Cath Lab visit)  Clinical Evaluation Leading to the Procedure:   ACS: No.  Non-ACS:    Anginal Classification: CCS III  Anti-ischemic medical therapy: Minimal Therapy (1 class of medications)  Non-Invasive Test Results: Intermediate-risk stress test findings: cardiac mortality 1-3%/year  Prior CABG: No previous CABG         Lauree Chandler

## 2017-10-27 ENCOUNTER — Ambulatory Visit: Payer: Medicare Other | Admitting: Cardiology

## 2017-11-01 ENCOUNTER — Ambulatory Visit: Payer: Medicare Other | Admitting: Adult Health

## 2017-11-02 ENCOUNTER — Ambulatory Visit: Payer: Medicare Other | Admitting: Adult Health

## 2017-11-03 ENCOUNTER — Encounter: Payer: Self-pay | Admitting: Cardiology

## 2017-11-03 ENCOUNTER — Ambulatory Visit: Payer: Medicare Other | Admitting: Cardiology

## 2017-11-03 VITALS — BP 137/73 | HR 61 | Ht 70.0 in | Wt 200.4 lb

## 2017-11-03 DIAGNOSIS — R0609 Other forms of dyspnea: Secondary | ICD-10-CM | POA: Diagnosis not present

## 2017-11-03 DIAGNOSIS — I10 Essential (primary) hypertension: Secondary | ICD-10-CM

## 2017-11-03 DIAGNOSIS — I429 Cardiomyopathy, unspecified: Secondary | ICD-10-CM

## 2017-11-03 NOTE — Patient Instructions (Signed)
Medication Instructions:  Your physician recommends that you continue on your current medications as directed. Please refer to the Current Medication list given to you today.   Labwork: NONE  Testing/Procedures: Your physician has requested that you have an echocardiogram. Echocardiography is a painless test that uses sound waves to create images of your heart. It provides your doctor with information about the size and shape of your heart and how well your heart's chambers and valves are working. This procedure takes approximately one hour. There are no restrictions for this procedure.    Follow-Up: Your physician recommends that you schedule a follow-up appointment in: 4 MONTHS    Any Other Special Instructions Will Be Listed Below (If Applicable).     If you need a refill on your cardiac medications before your next appointment, please call your pharmacy.   

## 2017-11-03 NOTE — Progress Notes (Signed)
Cardiology Office Note  Date: 11/03/2017   ID: NIKHOLAS GEFFRE, DOB February 18, 1946, MRN [id number]  PCP: Sharilyn Sites, MD  Primary Cardiologist: Rozann Lesches, MD   Chief Complaint  Patient presents with  . Cardiac follow-up    History of Present Illness: James Mccall is a 71 y.o. male last seen in November and subsequently referred for a diagnostic right and left heart catheterization in the setting of cardiomyopathy with exertional fatigue and decreased stamina in general.  Procedure was performed by Dr. Angelena Form on December 4 with reassuring results, no significant CAD and overall normal filling pressures.  He presents today for follow-up, went over the results in detail.  Overall, he got very reassuring information.  PASP was normal as well as LVEDP.  It is not clear to me that the patient's mildly reduced LVEF explains his symptoms.  We talked about continuing medical therapy aimed at blood pressure control, also a walking plan for exercise.  Past Medical History:  Diagnosis Date  . Arthritis   . 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 BPH with obstruction/lower urinary tract symptoms   . 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 GERD (gastroesophageal reflux disease)   . History of acute pyelonephritis 01/2017  . Hyperlipidemia   . Right ureteral stone   . Seasonal allergies     Past Surgical History:  Procedure Laterality Date  . CATARACT EXTRACTION W/PHACO Right 08/15/2015   Procedure: CATARACT EXTRACTION PHACO AND INTRAOCULAR LENS PLACEMENT (IOC);  Surgeon: Tonny Branch, MD;  Location: [facility];  Service: Ophthalmology;  Laterality: Right;  CDE:5.75  . CATARACT EXTRACTION W/PHACO Left 09/02/2015   Procedure: CATARACT EXTRACTION PHACO AND INTRAOCULAR LENS PLACEMENT LEFT EYE CDE=5.93;  Surgeon: Tonny Branch, MD;  Location: [facility];  Service: Ophthalmology;  Laterality: Left;  . COLONOSCOPY N/A 08/28/2013   Procedure: COLONOSCOPY;  Surgeon: Daneil Dolin,  MD;  [facility];  Service: Endoscopy;  Laterality: N/A;  9:45  . CYSTOSCOPY W/ URETERAL STENT PLACEMENT Right 01/26/2017   Procedure: CYSTOSCOPY WITH RETROGRADE PYELOGRAM/URETERAL STENT PLACEMENT;  Surgeon: Irine Seal, MD;  Location: [facility];  Service: Urology;  Laterality: Right;  . CYSTOSCOPY WITH URETEROSCOPY AND STENT PLACEMENT Right 03/05/2017   Procedure: cystoscopy with right ureteroscopy and stent exchange ;  Surgeon: Festus Aloe, MD;  Location[facility];  Service: Urology;  Laterality: Right;  . NASAL SINUS SURGERY  2012  . RIGHT/LEFT HEART CATH AND CORONARY ANGIOGRAPHY N/A 10/26/2017   Procedure: RIGHT/LEFT HEART CATH AND CORONARY ANGIOGRAPHY;  Surgeon: Burnell Blanks, MD;  Location: [place] [facility];  Service: Cardiovascular;  Laterality: N/A;  . ROTATOR CUFF REPAIR Bilateral 1998  . Vocal cord polyps  1990's   Benign    Current Outpatient Medications  Medication Sig Dispense Refill  . amLODipine (NORVASC) 5 MG tablet Take 5 mg by mouth daily.     Marland Kitchen aspirin EC 81 MG tablet Take 81 mg daily by mouth.    . diphenhydramine-acetaminophen (TYLENOL PM) 25-500 MG TABS Take 1-2 tablets by mouth at bedtime as needed (sleep).    Marland Kitchen esomeprazole (NEXIUM) 40 MG capsule Take 40 mg by mouth every morning.     Marland Kitchen ipratropium (ATROVENT) 0.03 % nasal spray Place 1 spray into both nostrils 2 (two) times daily as needed.     . meloxicam (MOBIC) 7.5 MG tablet Take 7.5 mg by mouth daily. Takes 2 tabs daily    .  montelukast (SINGULAIR) 10 MG tablet Take 10 mg by mouth every morning.     . simvastatin (ZOCOR) 20 MG tablet Take 20 mg by mouth every morning.     . tamsulosin (FLOMAX) 0.4 MG CAPS capsule Take 0.8 mg by mouth daily after breakfast.      No current facility-administered medications for this visit.    Allergies:  Patient has no known allergies.   Social History: The patient  reports that he quit smoking about 33 years ago. His smoking use included  cigarettes. He has a 22.50 pack-year smoking history. he has never used smokeless tobacco. He reports that he drinks alcohol. He reports that he does not use drugs.   ROS:  Please see the history of present illness. Otherwise, complete review of systems is positive for none.  All other systems are reviewed and negative.   Physical Exam: VS:  BP 137/73   Pulse 61   Ht 5\' 10"  (1.778 m)   Wt 200 lb 6.4 oz (90.9 kg)   BMI 28.75 kg/m , BMI Body mass index is 28.75 kg/m.  Wt Readings from Last 3 Encounters:  11/03/17 200 lb 6.4 oz (90.9 kg)  10/26/17 190 lb (86.2 kg)  09/28/17 198 lb (89.8 kg)    General: Patient appears comfortable at rest. HEENT: Conjunctiva and lids normal, oropharynx clear. Neck: Supple, no elevated JVP or carotid bruits, no thyromegaly. Lungs: Clear to auscultation, nonlabored breathing at rest. Cardiac: Regular rate and rhythm, soft systolic murmur, no pericardial rub. Abdomen: Soft, nontender, bowel sounds present, no guarding or rebound. Extremities: No pitting edema, distal pulses 2+. Skin: Warm and dry. Musculoskeletal: No kyphosis. Neuropsychiatric: Alert and oriented x3, affect grossly appropriate.  ECG: I personally reviewed the tracing from 10/26/2017 which showed sinus rhythm with PACs and nonspecific T wave changes.  Recent Labwork: 01/25/2017: ALT 9; AST 22 10/19/2017: BUN 17; Creatinine, Ser 1.09; Hemoglobin 12.9; Platelets 222; Potassium 3.9; Sodium 136  Other Studies Reviewed Today:  Left and right heart catheterization 10/26/2017: 1. No angiographic evidence of CAD 2. Normal filling pressures.   Fick Cardiac Output 6.66 L/min  Fick Cardiac Output Index 3.3 (L/min)/BSA  RA A Wave 9 mmHg  RA V Wave 8 mmHg  RA Mean 7 mmHg  RV Systolic Pressure 28 mmHg  RV Diastolic Pressure 6 mmHg  RV EDP 8 mmHg  PA Systolic Pressure 27 mmHg  PA Diastolic Pressure 9 mmHg  PA Mean 16 mmHg  PW A Wave 10 mmHg  PW V Wave 8 mmHg  PW Mean 7 mmHg  AO Systolic  Pressure 127 mmHg  AO Diastolic Pressure 60 mmHg  AO Mean 79 mmHg  LV Systolic Pressure 517 mmHg  LV Diastolic Pressure 10 mmHg  LV EDP 13 mmHg  Arterial Occlusion Pressure Extended Systolic Pressure 001 mmHg  Arterial Occlusion Pressure Extended Diastolic Pressure 61 mmHg  Arterial Occlusion Pressure Extended Mean Pressure 83 mmHg  Left Ventricular Apex Extended Systolic Pressure 749 mmHg  Left Ventricular Apex Extended Diastolic Pressure 7 mmHg  Left Ventricular Apex Extended EDP Pressure 10 mmHg  QP/QS 1  TPVR Index 4.85 HRUI  TSVR Index 23.98 HRUI  PVR SVR Ratio 0.13  TPVR/TSVR Ratio 0.2   Echocardiogram 08/31/2017: Study Conclusions  - Left ventricle: Diffuse hypokinesis worse in the inferior and   posterior lateral walls. The cavity size was mildly dilated.   There was mild concentric hypertrophy. Systolic function was   mildly reduced. The estimated ejection fraction was in the range  of 45% to 50%. Left ventricular diastolic function parameters   were normal. - Aortic valve: There was mild regurgitation. - Left atrium: The atrium was mildly dilated. - Pulmonary arteries: PA peak pressure: 36 mm Hg (S).  Assessment and Plan:  1.  Secondary cardiomyopathy with mildly reduced LVEF, 45-50% range by echocardiogram in October.  It is unlikely that his generalized change in stamina is specifically related to this based on reassuring cardiac catheterization results.  Recommending medical therapy and observation, also walking plan for exercise.  We will follow-up on echocardiogram with next visit.  2.  Essential hypertension, blood pressure control is adequate today.  Current medicines were reviewed with the patient today.   Orders Placed This Encounter  Procedures  . ECHOCARDIOGRAM COMPLETE    Disposition: Follow-up in 4 months.  Signed, Satira Sark, MD, [facility] 11/03/2017 12:00 PM    [facility] [place], [place], [place]  [address] Phone: [phone]; Fax: [phone]

## 2017-11-18 DIAGNOSIS — J3089 Other allergic rhinitis: Secondary | ICD-10-CM | POA: Diagnosis not present

## 2018-03-02 ENCOUNTER — Ambulatory Visit (HOSPITAL_COMMUNITY): Payer: Medicare Other | Attending: Cardiology

## 2018-06-02 DIAGNOSIS — D891 Cryoglobulinemia: Secondary | ICD-10-CM | POA: Diagnosis not present

## 2018-06-02 DIAGNOSIS — R5383 Other fatigue: Secondary | ICD-10-CM | POA: Diagnosis not present

## 2018-06-02 DIAGNOSIS — R195 Other fecal abnormalities: Secondary | ICD-10-CM | POA: Diagnosis not present

## 2018-06-02 DIAGNOSIS — Z1389 Encounter for screening for other disorder: Secondary | ICD-10-CM | POA: Diagnosis not present

## 2018-06-02 DIAGNOSIS — K5289 Other specified noninfective gastroenteritis and colitis: Secondary | ICD-10-CM | POA: Diagnosis not present

## 2018-06-22 ENCOUNTER — Inpatient Hospital Stay (HOSPITAL_COMMUNITY): Payer: Medicare Other | Admitting: Hematology

## 2018-06-24 ENCOUNTER — Inpatient Hospital Stay (HOSPITAL_COMMUNITY): Payer: Medicare Other

## 2018-06-24 ENCOUNTER — Encounter (HOSPITAL_COMMUNITY): Payer: Self-pay | Admitting: Hematology

## 2018-06-24 ENCOUNTER — Inpatient Hospital Stay (HOSPITAL_COMMUNITY): Payer: Medicare Other | Attending: Hematology | Admitting: Hematology

## 2018-06-24 ENCOUNTER — Other Ambulatory Visit: Payer: Self-pay

## 2018-06-24 DIAGNOSIS — I10 Essential (primary) hypertension: Secondary | ICD-10-CM | POA: Diagnosis not present

## 2018-06-24 DIAGNOSIS — K219 Gastro-esophageal reflux disease without esophagitis: Secondary | ICD-10-CM | POA: Diagnosis not present

## 2018-06-24 DIAGNOSIS — Z87442 Personal history of urinary calculi: Secondary | ICD-10-CM | POA: Insufficient documentation

## 2018-06-24 DIAGNOSIS — Z87891 Personal history of nicotine dependence: Secondary | ICD-10-CM

## 2018-06-24 DIAGNOSIS — Z7982 Long term (current) use of aspirin: Secondary | ICD-10-CM | POA: Diagnosis not present

## 2018-06-24 DIAGNOSIS — E785 Hyperlipidemia, unspecified: Secondary | ICD-10-CM | POA: Diagnosis not present

## 2018-06-24 DIAGNOSIS — Z79899 Other long term (current) drug therapy: Secondary | ICD-10-CM | POA: Insufficient documentation

## 2018-06-24 DIAGNOSIS — R718 Other abnormality of red blood cells: Secondary | ICD-10-CM | POA: Insufficient documentation

## 2018-06-24 DIAGNOSIS — D563 Thalassemia minor: Secondary | ICD-10-CM

## 2018-06-24 DIAGNOSIS — M129 Arthropathy, unspecified: Secondary | ICD-10-CM

## 2018-06-24 DIAGNOSIS — N400 Enlarged prostate without lower urinary tract symptoms: Secondary | ICD-10-CM

## 2018-06-24 LAB — COMPREHENSIVE METABOLIC PANEL
ALBUMIN: 4.2 g/dL (ref 3.5–5.0)
ALK PHOS: 62 U/L (ref 38–126)
ALT: 13 U/L (ref 0–44)
AST: 21 U/L (ref 15–41)
Anion gap: 6 (ref 5–15)
BUN: 19 mg/dL (ref 8–23)
CO2: 27 mmol/L (ref 22–32)
CREATININE: 0.91 mg/dL (ref 0.61–1.24)
Calcium: 9.3 mg/dL (ref 8.9–10.3)
Chloride: 106 mmol/L (ref 98–111)
GFR calc non Af Amer: >60 mL/min (ref >60)
GLUCOSE: 111 mg/dL — AB (ref 70–99)
Potassium: 4.3 mmol/L (ref 3.5–5.1)
SODIUM: 139 mmol/L (ref 135–145)
Total Bilirubin: 0.9 mg/dL (ref 0.3–1.2)
Total Protein: 8 g/dL (ref 6.5–8.1)

## 2018-06-24 LAB — CBC WITH DIFFERENTIAL/PLATELET
Basophils Absolute: 0 10*3/uL (ref 0.0–0.1)
Basophils Relative: 0 %
EOS ABS: 0.1 10*3/uL (ref 0.0–0.7)
Eosinophils Relative: 2 %
HCT: 41.7 % (ref 39.0–52.0)
HEMOGLOBIN: 13.6 g/dL (ref 13.0–17.0)
LYMPHS ABS: 2 10*3/uL (ref 0.7–4.0)
Lymphocytes Relative: 34 %
MCH: 23.7 pg — AB (ref 26.0–34.0)
MCHC: 32.6 g/dL (ref 30.0–36.0)
MCV: 72.5 fL — ABNORMAL LOW (ref 78.0–100.0)
Monocytes Absolute: 0.6 10*3/uL (ref 0.1–1.0)
Monocytes Relative: 10 %
NEUTROS PCT: 54 %
Neutro Abs: 3.2 10*3/uL (ref 1.7–7.7)
Platelets: 234 10*3/uL (ref 150–400)
RBC: 5.75 MIL/uL (ref 4.22–5.81)
RDW: 16 % — ABNORMAL HIGH (ref 11.5–15.5)
WBC: 5.9 10*3/uL (ref 4.0–10.5)

## 2018-06-24 LAB — IRON AND TIBC
Iron: 115 ug/dL (ref 45–182)
Saturation Ratios: 33 % (ref 17.9–39.5)
TIBC: 346 ug/dL (ref 250–450)
UIBC: 231 ug/dL

## 2018-06-24 LAB — FERRITIN: Ferritin: 172 ng/mL (ref 24–336)

## 2018-06-24 NOTE — Patient Instructions (Signed)
[place] at [facility] Discharge Instructions  Please have labs drawn on the first floor before you leave the hospital. Follow up with [place] in 2 weeks   Thank you for choosing [place] at [facility] to provide your oncology and hematology care.  To afford each patient quality time with our provider, please arrive at least 15 minutes before your scheduled appointment time.   If you have a lab appointment with the [facility] please come in thru the  Main Entrance and check in at the main information desk  You need to re-schedule your appointment should you arrive 10 or more minutes late.  We strive to give you quality time with our providers, and arriving late affects you and other patients whose appointments are after yours.  Also, if you no show three or more times for appointments you may be dismissed from the clinic at the providers discretion.     Again, thank you for choosing [facility].  Our hope is that these requests will decrease the amount of time that you wait before being seen by our physicians.       _____________________________________________________________  Should you have questions after your visit to [facility], please contact our office at [phone] between the hours of 8:00 a.m. and 4:30 p.m.  Voicemails left after 4:00 p.m. will not be returned until the following business day.  For prescription refill requests, have your pharmacy contact our office and allow 72 hours.    Cancer Center Support Programs:   > Cancer Support Group  2nd Tuesday of the month 1pm-2pm, Journey Room

## 2018-06-24 NOTE — Progress Notes (Signed)
CONSULT NOTE  Patient Care Team: Sharilyn Sites, MD as PCP - General (Family Medicine) Danie Binder, MD as Consulting Physician (Gastroenterology)  CHIEF COMPLAINTS/PURPOSE OF CONSULTATION:  Abnormal lymphocytes on recent CBC D.  HISTORY OF PRESENTING ILLNESS:  James Mccall 72 y.o. male is seen in consultation today for further work-up and management of abnormal CBC D.  Patient had blood work drawn at Dr. Nolon Rod office on 06/02/2018 which showed a normal hemoglobin of 13.3 with normal white count.  However differential showed 31% lymphocytes with few plasmacytoid lymphocytes and reactive lymphocytes.  Other parts of his CBC was normal except low MCV.  Denies any history of blood transfusions.  Was never on iron supplements in the past.  Denies any fevers, night sweats or weight loss.  Complains of progressive fatigue in the last 6 months.  Reportedly had dental work done prior to his blood work.  After dental work, he developed infection in his tooth.  He was given Augmentin which resulted in diarrhea.  He denies any shortness of breath on exertion or presyncopal episodes.  Energy levels are rated as 50% and appetite at 75%.  He lives at home by himself.  He worked for [employer] for 22 years.  He is currently self-employed.  He does all his household activities.  He smoked 1 pack/day for 10 years and quit 30 years ago.  No chemical exposure related to work. No family history of malignancies or blood conditions like thalassemia and sickle cell disease.   MEDICAL HISTORY:  Past Medical History:  Diagnosis Date  . Arthritis   . 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 BPH with obstruction/lower urinary tract symptoms   . 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 GERD (gastroesophageal reflux disease)   . History of acute pyelonephritis 01/2017  . Hyperlipidemia   . Right ureteral stone   . Seasonal allergies     SURGICAL HISTORY: Past Surgical  History:  Procedure Laterality Date  . CATARACT EXTRACTION W/PHACO Right 08/15/2015   Procedure: CATARACT EXTRACTION PHACO AND INTRAOCULAR LENS PLACEMENT (IOC);  Surgeon: Tonny Branch, MD;  Location: [facility];  Service: Ophthalmology;  Laterality: Right;  CDE:5.75  . CATARACT EXTRACTION W/PHACO Left 09/02/2015   Procedure: CATARACT EXTRACTION PHACO AND INTRAOCULAR LENS PLACEMENT LEFT EYE CDE=5.93;  Surgeon: Tonny Branch, MD;  Location: [facility];  Service: Ophthalmology;  Laterality: Left;  . COLONOSCOPY N/A 08/28/2013   Procedure: COLONOSCOPY;  Surgeon: Daneil Dolin, MD;  [facility];  Service: Endoscopy;  Laterality: N/A;  9:45  . CYSTOSCOPY W/ URETERAL STENT PLACEMENT Right 01/26/2017   Procedure: CYSTOSCOPY WITH RETROGRADE PYELOGRAM/URETERAL STENT PLACEMENT;  Surgeon: Irine Seal, MD;  Location[facility];  Service: Urology;  Laterality: Right;  . CYSTOSCOPY WITH URETEROSCOPY AND STENT PLACEMENT Right 03/05/2017   Procedure: cystoscopy with right ureteroscopy and stent exchange ;  Surgeon: Festus Aloe, MD;  Location: [facility];  Service: Urology;  Laterality: Right;  . NASAL SINUS SURGERY  2012  . RIGHT/LEFT HEART CATH AND CORONARY ANGIOGRAPHY N/A 10/26/2017   Procedure: RIGHT/LEFT HEART CATH AND CORONARY ANGIOGRAPHY;  Surgeon: Burnell Blanks, MD;  Location: [place] [facility];  Service: Cardiovascular;  Laterality: N/A;  . ROTATOR CUFF REPAIR Bilateral 1998  . Vocal cord polyps  1990's   Benign    SOCIAL HISTORY: Social History   Socioeconomic History  . Marital status: Widowed    Spouse name: Not on  file  . Number of children: 4  . Years of education: Not on file  . Highest education level: Not on file  Occupational History  . 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 Occupation: Self employed  Social Needs  . Financial resource strain: Not on file  . Food insecurity:    Worry: Not on file    Inability: Not on  file  . Transportation needs:    Medical: Not on file    Non-medical: Not on file  Tobacco Use  . Smoking status: Former Smoker    Packs/day: 1.50    Years: 15.00    Pack years: 22.50    Types: Cigarettes    Last attempt to quit: 08/12/1984    Years since quitting: 33.8  . Smokeless tobacco: Never Used  Substance and Sexual Activity  . Alcohol use: Yes    Comment: 1-2 weekly  . Drug use: No  . Sexual activity: Not on file  Lifestyle  . Physical activity:    Days per week: Not on file    Minutes per session: Not on file  . Stress: Not on file  Relationships  . Social connections:    Talks on phone: Not on file    Gets together: Not on file    Attends religious service: Not on file    Active member of club or organization: Not on file    Attends meetings of clubs or organizations: Not on file    Relationship status: Not on file  . Intimate partner violence:    Fear of current or ex partner: Not on file    Emotionally abused: Not on file    Physically abused: Not on file    Forced sexual activity: Not on file  Other Topics Concern  . Not on file  Social History Narrative  . Not on file    FAMILY HISTORY: Family History  Problem Relation Age of Onset  . Hyperlipidemia Mother   . Hypertension Mother   . Hypertension Sister   . Hypertension Brother   . Colon cancer Neg Hx     ALLERGIES:  has No Known Allergies.  MEDICATIONS:  Current Outpatient Medications  Medication Sig Dispense Refill  . amLODipine (NORVASC) 5 MG tablet Take 5 mg by mouth daily.     Marland Kitchen aspirin EC 81 MG tablet Take 81 mg daily by mouth.    . esomeprazole (NEXIUM) 40 MG capsule Take 40 mg by mouth every morning.     . fluticasone (FLONASE) 50 MCG/ACT nasal spray Place into the nose.    Marland Kitchen ipratropium (ATROVENT) 0.03 % nasal spray Place 1 spray into both nostrils 2 (two) times daily as needed.     . meloxicam (MOBIC) 7.5 MG tablet Take 7.5 mg by mouth daily. Takes 2 tabs daily    . montelukast  (SINGULAIR) 10 MG tablet Take 10 mg by mouth every morning.     . simvastatin (ZOCOR) 20 MG tablet Take 20 mg by mouth every morning.     . tamsulosin (FLOMAX) 0.4 MG CAPS capsule Take 0.8 mg by mouth daily after breakfast.      No current facility-administered medications for this visit.     REVIEW OF SYSTEMS:   Constitutional: Denies fevers, chills or abnormal night sweats.  Positive for fatigue. Eyes: Denies blurriness of vision, double vision or watery eyes Ears, nose, mouth, throat, and face: Denies mucositis or sore throat Respiratory: Denies cough, dyspnea or  wheezes Cardiovascular: Denies palpitation, chest discomfort or lower extremity swelling Gastrointestinal:  Denies nausea, heartburn or change in bowel habits Skin: Denies abnormal skin rashes Lymphatics: Denies new lymphadenopathy or easy bruising Neurological:Denies numbness, tingling or new weaknesses Behavioral/Psych: Mood is stable, no new changes  All other systems were reviewed with the patient and are negative.  PHYSICAL EXAMINATION: ECOG PERFORMANCE STATUS: 1 - Symptomatic but completely ambulatory  Vitals:   06/24/18 1153  BP: 140/78  Pulse: 63  Resp: 16  Temp: 98.4 F (36.9 C)  SpO2: 100%   Filed Weights   06/24/18 1153  Weight: 194 lb 8 oz (88.2 kg)    GENERAL:alert, no distress and comfortable SKIN: skin color, texture, turgor are normal, no rashes or significant lesions EYES: normal, conjunctiva are pink and non-injected, sclera clear OROPHARYNX:no exudate, no erythema and lips, buccal mucosa, and tongue normal  NECK: supple, thyroid normal size, non-tender, without nodularity LYMPH:  no palpable lymphadenopathy in the cervical, axillary or inguinal LUNGS: clear to auscultation and percussion with normal breathing effort HEART: regular rate & rhythm and no murmurs and no lower extremity edema ABDOMEN:abdomen soft, non-tender and normal bowel sounds PSYCH: alert & oriented x 3 with fluent  speech   LABORATORY DATA:  I have reviewed the data as listed Recent Results (from the past 2160 hour(s))  CBC with Differential/Platelet     Status: Abnormal   Collection Time: 06/24/18 12:53 PM  Result Value Ref Range   WBC 5.9 4.0 - 10.5 K/uL   RBC 5.75 4.22 - 5.81 MIL/uL   Hemoglobin 13.6 13.0 - 17.0 g/dL   HCT 41.7 39.0 - 52.0 %   MCV 72.5 (L) 78.0 - 100.0 fL   MCH 23.7 (L) 26.0 - 34.0 pg   MCHC 32.6 30.0 - 36.0 g/dL   RDW 16.0 (H) 11.5 - 15.5 %   Platelets 234 150 - 400 K/uL   Neutrophils Relative % 54 %   Neutro Abs 3.2 1.7 - 7.7 K/uL   Lymphocytes Relative 34 %   Lymphs Abs 2.0 0.7 - 4.0 K/uL   Monocytes Relative 10 %   Monocytes Absolute 0.6 0.1 - 1.0 K/uL   Eosinophils Relative 2 %   Eosinophils Absolute 0.1 0.0 - 0.7 K/uL   Basophils Relative 0 %   Basophils Absolute 0.0 0.0 - 0.1 K/uL    Comment: Performed at [facility], [address], [place], [place] [address]  Comprehensive metabolic panel     Status: Abnormal   Collection Time: 06/24/18 12:53 PM  Result Value Ref Range   Sodium 139 135 - 145 mmol/L   Potassium 4.3 3.5 - 5.1 mmol/L   Chloride 106 98 - 111 mmol/L   CO2 27 22 - 32 mmol/L   Glucose, Bld 111 (H) 70 - 99 mg/dL   BUN 19 8 - 23 mg/dL   Creatinine, Ser 0.91 0.61 - 1.24 mg/dL   Calcium 9.3 8.9 - 10.3 mg/dL   Total Protein 8.0 6.5 - 8.1 g/dL   Albumin 4.2 3.5 - 5.0 g/dL   AST 21 15 - 41 U/L   ALT 13 0 - 44 U/L   Alkaline Phosphatase 62 38 - 126 U/L   Total Bilirubin 0.9 0.3 - 1.2 mg/dL   GFR calc non Af Amer >60 >60 mL/min   GFR calc Af Amer >60 >60 mL/min    Comment: (NOTE) The eGFR has been calculated using the CKD EPI equation. This calculation has not been validated in all clinical situations.  eGFR's persistently <60 mL/min signify possible Chronic Kidney Disease.    Anion gap 6 5 - 15    Comment: Performed at [facility], [address] [place], [place] [address]    ASSESSMENT & PLAN:  RBC microcytosis 1.  Abnormal lymphocytes  on CBCD: - Most recent blood work on 06/02/2018 done at Dr. Nolon Rod office showed a normal CBC with normal differential but few plasmacytoid/reactive lymphocytes were reported. - Patient reports having dental work done followed by infection in his tooth and took Augmentin prior to this lab work.  He also had antibiotic induced diarrhea at that time. -Denies any fevers, night sweats or weight loss in the last 6 months.  Most likely the abnormal lymphocytes are reactive in nature from prior infection. -However complains of worsening fatigue in the last 6 months.  We will repeat his CBC with differential and review his smear.  We will also check LDH.  For his fatigue, we will send testosterone levels.  2.  Microcytosis: -This patient has microcytosis despite normal hemoglobin levels.  Does not give any family history of thalassemia or sickle cell disease. - Could be from underlying thalassemia trait.  Will send hemoglobin electrophoresis.   All questions were answered. The patient knows to call the clinic with any problems, questions or concerns.     Derek Jack, MD 06/24/18 4:54 PM

## 2018-06-24 NOTE — Assessment & Plan Note (Addendum)
1.  Abnormal lymphocytes on CBCD: - Most recent blood work on 06/02/2018 done at Dr. Nolon Rod office showed a normal CBC with normal differential but few plasmacytoid/reactive lymphocytes were reported. - Patient reports having dental work done followed by infection in his tooth and took Augmentin prior to this lab work.  He also had antibiotic induced diarrhea at that time. -Denies any fevers, night sweats or weight loss in the last 6 months.  Most likely the abnormal lymphocytes are reactive in nature from prior infection. -However complains of worsening fatigue in the last 6 months.  We will repeat his CBC with differential and review his smear.  We will also check LDH.  For his fatigue, we will send testosterone levels.  2.  Microcytosis: -This patient has microcytosis despite normal hemoglobin levels.  Does not give any family history of thalassemia or sickle cell disease. - Could be from underlying thalassemia trait.  Will send hemoglobin electrophoresis.

## 2018-07-12 ENCOUNTER — Inpatient Hospital Stay (HOSPITAL_COMMUNITY): Payer: Medicare Other

## 2018-07-12 ENCOUNTER — Other Ambulatory Visit: Payer: Self-pay

## 2018-07-12 ENCOUNTER — Inpatient Hospital Stay (HOSPITAL_COMMUNITY): Payer: Medicare Other | Attending: Hematology | Admitting: Hematology

## 2018-07-12 ENCOUNTER — Encounter (HOSPITAL_COMMUNITY): Payer: Self-pay | Admitting: Hematology

## 2018-07-12 VITALS — BP 138/79 | HR 62 | Temp 98.4°F | Resp 18 | Wt 197.5 lb

## 2018-07-12 DIAGNOSIS — D563 Thalassemia minor: Secondary | ICD-10-CM | POA: Insufficient documentation

## 2018-07-12 DIAGNOSIS — K219 Gastro-esophageal reflux disease without esophagitis: Secondary | ICD-10-CM | POA: Diagnosis not present

## 2018-07-12 DIAGNOSIS — E785 Hyperlipidemia, unspecified: Secondary | ICD-10-CM | POA: Diagnosis not present

## 2018-07-12 DIAGNOSIS — Z79899 Other long term (current) drug therapy: Secondary | ICD-10-CM | POA: Diagnosis not present

## 2018-07-12 DIAGNOSIS — Z7982 Long term (current) use of aspirin: Secondary | ICD-10-CM | POA: Insufficient documentation

## 2018-07-12 DIAGNOSIS — R718 Other abnormality of red blood cells: Secondary | ICD-10-CM | POA: Insufficient documentation

## 2018-07-12 DIAGNOSIS — Z87442 Personal history of urinary calculi: Secondary | ICD-10-CM

## 2018-07-12 DIAGNOSIS — N400 Enlarged prostate without lower urinary tract symptoms: Secondary | ICD-10-CM | POA: Diagnosis not present

## 2018-07-12 DIAGNOSIS — Z87891 Personal history of nicotine dependence: Secondary | ICD-10-CM | POA: Diagnosis not present

## 2018-07-12 LAB — CBC WITH DIFFERENTIAL/PLATELET
BASOS ABS: 0 10*3/uL (ref 0.0–0.1)
BASOS PCT: 0 %
Eosinophils Absolute: 0.1 10*3/uL (ref 0.0–0.7)
Eosinophils Relative: 1 %
HEMATOCRIT: 36.9 % — AB (ref 39.0–52.0)
HEMOGLOBIN: 13 g/dL (ref 13.0–17.0)
Lymphocytes Relative: 41 %
Lymphs Abs: 2.3 10*3/uL (ref 0.7–4.0)
MCH: 28.1 pg (ref 26.0–34.0)
MCHC: 35.2 g/dL (ref 30.0–36.0)
MCV: 79.9 fL (ref 78.0–100.0)
MONO ABS: 0.5 10*3/uL (ref 0.1–1.0)
Monocytes Relative: 10 %
NEUTROS ABS: 2.6 10*3/uL (ref 1.7–7.7)
NEUTROS PCT: 48 %
Platelets: 201 10*3/uL (ref 150–400)
RBC: 4.62 MIL/uL (ref 4.22–5.81)
RDW: 18.2 % — ABNORMAL HIGH (ref 11.5–15.5)
WBC: 5.5 10*3/uL (ref 4.0–10.5)

## 2018-07-12 LAB — LACTATE DEHYDROGENASE: LDH: 145 U/L (ref 98–192)

## 2018-07-12 NOTE — Patient Instructions (Addendum)
[place] at [facility] Discharge Instructions  Please have labs drawn today on the first floor. We will see you back in 1-2 weeks after labs are back.     Thank you for choosing [place] at [facility] to provide your oncology and hematology care.  To afford each patient quality time with our provider, please arrive at least 15 minutes before your scheduled appointment time.   If you have a lab appointment with the [facility] please come in thru the  Main Entrance and check in at the main information desk  You need to re-schedule your appointment should you arrive 10 or more minutes late.  We strive to give you quality time with our providers, and arriving late affects you and other patients whose appointments are after yours.  Also, if you no show three or more times for appointments you may be dismissed from the clinic at the providers discretion.     Again, thank you for choosing [facility]  Our hope is that these requests will decrease the amount of time that you wait before being seen by our physicians.       _____________________________________________________________  Should you have questions after your visit to [facility], please contact our office at [phone] between the hours of 8:00 a.m. and 4:30 p.m.  Voicemails left after 4:00 p.m. will not be returned until the following business day.  For prescription refill requests, have your pharmacy contact our office and allow 72 hours.    Cancer Center Support Programs:   > Cancer Support Group  2nd Tuesday of the month 1pm-2pm, Journey Room

## 2018-07-12 NOTE — Assessment & Plan Note (Signed)
1.  Abnormal lymphocytes on CBCD: - Most recent blood work on 06/02/2018 done at Dr. Nolon Rod office showed a normal CBC with normal differential but few plasmacytoid/reactive lymphocytes were reported. - Patient reports having dental work done followed by infection in his tooth and took Augmentin prior to this lab work.  He also had antibiotic induced diarrhea at that time. -Denies any fevers, night sweats or weight loss in the last 6 months.  Most likely the abnormal lymphocytes are reactive in nature from prior infection. - He complained of worsening fatigue for past 6 weeks at last visit.  Today his fatigue is improved.  I went over the results of the CBC which showed microcytosis.  No abnormal lymphocytes were found. -However some of labs were not sent at last visit.  We will send hemoglobin electrophoresis today along with LDH and peripheral blood smear review.  2.  Microcytosis: -This patient has microcytosis despite normal hemoglobin levels.  Does not give any family history of thalassemia or sickle cell disease. - Could be from underlying thalassemia trait.  Will send hemoglobin electrophoresis.

## 2018-07-12 NOTE — Progress Notes (Signed)
James Mccall, [place] [address]   CLINIC:  Medical Oncology/Hematology  PCP:  Sharilyn Sites, [place] [address] [phone]   REASON FOR VISIT:  Follow-up for Abnormal lymphocytes  CURRENT THERAPY: work up   INTERVAL HISTORY:  James Mccall 72 y.o. male returns for routine follow-up for abnormal lymphocytes. Patient is here today to follow up on his blood work. He has no complaint at this time. He report his appetite is 100% and his energy levels are 75%. He denies any pain. Denies any nausea, vomiting, or diarrhea. Denies any fevers or recent infections. Patient lives at home and performs all his own ADLs and activities.      REVIEW OF SYSTEMS:  Review of Systems  All other systems reviewed and are negative.    PAST MEDICAL/SURGICAL HISTORY:  Past Medical History:  Diagnosis Date  . Arthritis   . 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 BPH with obstruction/lower urinary tract symptoms   . 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 GERD (gastroesophageal reflux disease)   . History of acute pyelonephritis 01/2017  . Hyperlipidemia   . Right ureteral stone   . Seasonal allergies    Past Surgical History:  Procedure Laterality Date  . CATARACT EXTRACTION W/PHACO Right 08/15/2015   Procedure: CATARACT EXTRACTION PHACO AND INTRAOCULAR LENS PLACEMENT (IOC);  Surgeon: Tonny Branch, MD;  Location: [facility];  Service: Ophthalmology;  Laterality: Right;  CDE:5.75  . CATARACT EXTRACTION W/PHACO Left 09/02/2015   Procedure: CATARACT EXTRACTION PHACO AND INTRAOCULAR LENS PLACEMENT LEFT EYE CDE=5.93;  Surgeon: Tonny Branch, MD;  Location: [facility];  Service: Ophthalmology;  Laterality: Left;  . COLONOSCOPY N/A 08/28/2013   Procedure: COLONOSCOPY;  Surgeon: Daneil Dolin, MD;  [facility];  Service: Endoscopy;  Laterality: N/A;  9:45  . CYSTOSCOPY W/ URETERAL STENT PLACEMENT Right 01/26/2017   Procedure: CYSTOSCOPY WITH RETROGRADE PYELOGRAM/URETERAL STENT PLACEMENT;  Surgeon: Irine Seal, MD;  Location: [facility];  Service: Urology;  Laterality: Right;  . CYSTOSCOPY WITH URETEROSCOPY AND STENT PLACEMENT Right 03/05/2017   Procedure: cystoscopy with right ureteroscopy and stent exchange ;  Surgeon: Festus Aloe, MD;  [facility];  Service: Urology;  Laterality: Right;  . NASAL SINUS SURGERY  2012  . RIGHT/LEFT HEART CATH AND CORONARY ANGIOGRAPHY N/A 10/26/2017   Procedure: RIGHT/LEFT HEART CATH AND CORONARY ANGIOGRAPHY;  Surgeon: Burnell Blanks, MD;  Location: [place] [facility];  Service: Cardiovascular;  Laterality: N/A;  . ROTATOR CUFF REPAIR Bilateral 1998  . Vocal cord polyps  1990's   Benign     SOCIAL HISTORY:  Social History   Socioeconomic History  . Marital status: Widowed    Spouse name: Not on file  . Number of children: 4  . Years of education: Not on file  . Highest education level: Not on file  Occupational History  . 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 Occupation: Self employed  Social Needs  . Financial resource strain: Not on file  . Food insecurity:    Worry: Not on file    Inability: Not on file  . Transportation needs:    Medical: Not on file    Non-medical: Not on file  Tobacco Use  . Smoking status: Former Smoker    Packs/day: 1.50    Years: 15.00    Pack years: 22.50  Types: Cigarettes    Last attempt to quit: 08/12/1984    Years since quitting: 33.9  . Smokeless tobacco: Never Used  Substance and Sexual Activity  . Alcohol use: Yes    Comment: 1-2 weekly  . Drug use: No  . Sexual activity: Not on file  Lifestyle  . Physical activity:    Days per week: Not on file    Minutes per session: Not on file  . Stress: Not on file  Relationships  . Social connections:    Talks on phone: Not on file    Gets together: Not on file    Attends religious service: Not on  file    Active member of club or organization: Not on file    Attends meetings of clubs or organizations: Not on file    Relationship status: Not on file  . Intimate partner violence:    Fear of current or ex partner: Not on file    Emotionally abused: Not on file    Physically abused: Not on file    Forced sexual activity: Not on file  Other Topics Concern  . Not on file  Social History Narrative  . Not on file    FAMILY HISTORY:  Family History  Problem Relation Age of Onset  . Hyperlipidemia Mother   . Hypertension Mother   . Hypertension Sister   . Hypertension Brother   . Colon cancer Neg Hx     CURRENT MEDICATIONS:  Outpatient Encounter Medications as of 07/12/2018  Medication Sig  . amLODipine (NORVASC) 5 MG tablet Take 5 mg by mouth daily.   Marland Kitchen aspirin EC 81 MG tablet Take 81 mg daily by mouth.  . esomeprazole (NEXIUM) 40 MG capsule Take 40 mg by mouth every morning.   . fluticasone (FLONASE) 50 MCG/ACT nasal spray Place into the nose.  Marland Kitchen ipratropium (ATROVENT) 0.03 % nasal spray Place 1 spray into both nostrils 2 (two) times daily as needed.   . meloxicam (MOBIC) 7.5 MG tablet Take 7.5 mg by mouth daily. Takes 2 tabs daily  . montelukast (SINGULAIR) 10 MG tablet Take 10 mg by mouth every morning.   . simvastatin (ZOCOR) 20 MG tablet Take 20 mg by mouth every morning.   . tamsulosin (FLOMAX) 0.4 MG CAPS capsule Take 0.8 mg by mouth daily after breakfast.    No facility-administered encounter medications on file as of 07/12/2018.     ALLERGIES:  No Known Allergies   PHYSICAL EXAM:  ECOG Performance status: 0  Vitals:   07/12/18 1137  BP: 138/79  Pulse: 62  Resp: 18  Temp: 98.4 F (36.9 C)  SpO2: 100%   Filed Weights   07/12/18 1137  Weight: 197 lb 8 oz (89.6 kg)    Physical Exam   LABORATORY DATA:  I have reviewed the labs as listed.  CBC    Component Value Date/Time   WBC 5.5 07/12/2018 1230   RBC 4.62 07/12/2018 1230   HGB 13.0 07/12/2018  1230   HCT 36.9 (L) 07/12/2018 1230   PLT 201 07/12/2018 1230   MCV 79.9 07/12/2018 1230   MCH 28.1 07/12/2018 1230   MCHC 35.2 07/12/2018 1230   RDW 18.2 (H) 07/12/2018 1230   LYMPHSABS 2.3 07/12/2018 1230   MONOABS 0.5 07/12/2018 1230   EOSABS 0.1 07/12/2018 1230   BASOSABS 0.0 07/12/2018 1230   CMP Latest Ref Rng & Units 06/24/2018 10/19/2017 01/28/2017  Glucose 70 - 99 mg/dL 111(H) 108(H) 109(H)  BUN 8 -  23 mg/dL 19 17 19   Creatinine 0.61 - 1.24 mg/dL 0.91 1.09 1.20  Sodium 135 - 145 mmol/L 139 136 140  Potassium 3.5 - 5.1 mmol/L 4.3 3.9 4.2  Chloride 98 - 111 mmol/L 106 107 108  CO2 22 - 32 mmol/L 27 23 24   Calcium 8.9 - 10.3 mg/dL 9.3 9.2 8.9  Total Protein 6.5 - 8.1 g/dL 8.0 - -  Total Bilirubin 0.3 - 1.2 mg/dL 0.9 - -  Alkaline Phos 38 - 126 U/L 62 - -  AST 15 - 41 U/L 21 - -  ALT 0 - 44 U/L 13 - -          ASSESSMENT & PLAN:   RBC microcytosis 1.  Abnormal lymphocytes on CBCD: - Most recent blood work on 06/02/2018 done at Dr. Nolon Rod office showed a normal CBC with normal differential but few plasmacytoid/reactive lymphocytes were reported. - Patient reports having dental work done followed by infection in his tooth and took Augmentin prior to this lab work.  He also had antibiotic induced diarrhea at that time. -Denies any fevers, night sweats or weight loss in the last 6 months.  Most likely the abnormal lymphocytes are reactive in nature from prior infection. - He complained of worsening fatigue for past 6 weeks at last visit.  Today his fatigue is improved.  I went over the results of the CBC which showed microcytosis.  No abnormal lymphocytes were found. -However some of labs were not sent at last visit.  We will send hemoglobin electrophoresis today along with LDH and peripheral blood smear review.  2.  Microcytosis: -This patient has microcytosis despite normal hemoglobin levels.  Does not give any family history of thalassemia or sickle cell disease. - Could  be from underlying thalassemia trait.  Will send hemoglobin electrophoresis.      Orders placed this encounter:  Orders Placed This Encounter  Procedures  . CBC with Differential/Platelet  . Lactate dehydrogenase  . Testosterone  . Hemoglobinopathy evaluation  . Pathologist smear review      Derek Jack, MD [place] [phone]

## 2018-07-13 LAB — HEMOGLOBINOPATHY EVALUATION
HGB A2 QUANT: 5.7 % — AB (ref 1.8–3.2)
HGB C: 0 %
HGB F QUANT: 0 % (ref 0.0–2.0)
HGB S QUANTITAION: 0 %
Hgb A: 94.3 % — ABNORMAL LOW (ref 96.4–98.8)
Hgb Variant: 0 %

## 2018-07-13 LAB — PATHOLOGIST SMEAR REVIEW

## 2018-07-13 LAB — TESTOSTERONE: Testosterone: 538 ng/dL (ref 264–916)

## 2018-07-29 ENCOUNTER — Ambulatory Visit (HOSPITAL_COMMUNITY): Payer: Medicare Other | Admitting: Hematology

## 2018-09-06 DIAGNOSIS — J329 Chronic sinusitis, unspecified: Secondary | ICD-10-CM | POA: Diagnosis not present

## 2018-09-06 DIAGNOSIS — Z0000 Encounter for general adult medical examination without abnormal findings: Secondary | ICD-10-CM | POA: Diagnosis not present

## 2018-09-06 DIAGNOSIS — R7309 Other abnormal glucose: Secondary | ICD-10-CM | POA: Diagnosis not present

## 2018-09-06 DIAGNOSIS — Z23 Encounter for immunization: Secondary | ICD-10-CM | POA: Diagnosis not present

## 2018-09-06 DIAGNOSIS — R739 Hyperglycemia, unspecified: Secondary | ICD-10-CM | POA: Diagnosis not present

## 2018-09-06 DIAGNOSIS — Z1389 Encounter for screening for other disorder: Secondary | ICD-10-CM | POA: Diagnosis not present

## 2018-09-12 DIAGNOSIS — R3912 Poor urinary stream: Secondary | ICD-10-CM | POA: Diagnosis not present

## 2018-10-11 DIAGNOSIS — R350 Frequency of micturition: Secondary | ICD-10-CM | POA: Diagnosis not present

## 2018-10-11 DIAGNOSIS — R3912 Poor urinary stream: Secondary | ICD-10-CM | POA: Diagnosis not present

## 2018-10-18 DIAGNOSIS — J329 Chronic sinusitis, unspecified: Secondary | ICD-10-CM | POA: Diagnosis not present

## 2018-10-18 DIAGNOSIS — K219 Gastro-esophageal reflux disease without esophagitis: Secondary | ICD-10-CM | POA: Diagnosis not present

## 2019-01-26 NOTE — Progress Notes (Signed)
Cardiology Office Note  Date: 01/27/2019   ID: James Mccall, DOB 21-Feb-1946, MRN [id number]  PCP: Sharilyn Sites, MD  Primary Cardiologist: Rozann Lesches, MD   Chief Complaint  Patient presents with  . Cardiomyopathy    History of Present Illness: James Mccall is a 73 y.o. male last seen in December 2018.  He presents overdue for follow-up.  Since last assessment, he does not report any major change in health.  He has not been exercising regularly, has noticed a general change in stamina over the years.  He does not report exertional chest pain, palpitations, or syncope.  I reviewed his current medications.  We discussed stopping aspirin at this point.  He continues on Norvasc and Zocor.  We discussed obtaining a follow-up echocardiogram for reassessment of LVEF.  Last evaluation in 2018 was 45 to 50% range.  He had no significant coronary artery disease by cardiac catheterization.  I personally reviewed his ECG today which shows sinus rhythm with inferior Q waves, rule out old inferior infarct pattern, nonspecific ST-T changes.  Past Medical History:  Diagnosis Date  . Arthritis   . 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 BPH with obstruction/lower urinary tract symptoms   . 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 GERD (gastroesophageal reflux disease)   . History of acute pyelonephritis 01/2017  . Hyperlipidemia   . Right ureteral stone   . Seasonal allergies     Past Surgical History:  Procedure Laterality Date  . CATARACT EXTRACTION W/PHACO Right 08/15/2015   Procedure: CATARACT EXTRACTION PHACO AND INTRAOCULAR LENS PLACEMENT (IOC);  Surgeon: Tonny Branch, MD;  Location: [facility];  Service: Ophthalmology;  Laterality: Right;  CDE:5.75  . CATARACT EXTRACTION W/PHACO Left 09/02/2015   Procedure: CATARACT EXTRACTION PHACO AND INTRAOCULAR LENS PLACEMENT LEFT EYE CDE=5.93;  Surgeon: Tonny Branch, MD;  Location: [facility];  Service: Ophthalmology;   Laterality: Left;  . COLONOSCOPY N/A 08/28/2013   Procedure: COLONOSCOPY;  Surgeon: Daneil Dolin, MD;  [facility];  Service: Endoscopy;  Laterality: N/A;  9:45  . CYSTOSCOPY W/ URETERAL STENT PLACEMENT Right 01/26/2017   Procedure: CYSTOSCOPY WITH RETROGRADE PYELOGRAM/URETERAL STENT PLACEMENT;  Surgeon: Irine Seal, MD;  Location[facility];  Service: Urology;  Laterality: Right;  . CYSTOSCOPY WITH URETEROSCOPY AND STENT PLACEMENT Right 03/05/2017   Procedure: cystoscopy with right ureteroscopy and stent exchange ;  Surgeon: Festus Aloe, MD;  Location: [facility];  Service: Urology;  Laterality: Right;  . NASAL SINUS SURGERY  2012  . RIGHT/LEFT HEART CATH AND CORONARY ANGIOGRAPHY N/A 10/26/2017   Procedure: RIGHT/LEFT HEART CATH AND CORONARY ANGIOGRAPHY;  Surgeon: Burnell Blanks, MD;  Location: [place] [facility];  Service: Cardiovascular;  Laterality: N/A;  . ROTATOR CUFF REPAIR Bilateral 1998  . Vocal cord polyps  1990's   Benign    Current Outpatient Medications  Medication Sig Dispense Refill  . amLODipine (NORVASC) 5 MG tablet Take 5 mg by mouth daily.     Marland Kitchen esomeprazole (NEXIUM) 40 MG capsule Take 40 mg by mouth every morning.     . fluticasone (FLONASE) 50 MCG/ACT nasal spray Place into the nose.    Marland Kitchen ipratropium (ATROVENT) 0.03 % nasal spray Place 1 spray into both nostrils 2 (two) times daily as needed.     . meloxicam (MOBIC) 7.5 MG tablet Take 7.5 mg by mouth daily. Takes 2 tabs daily    . montelukast (SINGULAIR) 10  MG tablet Take 10 mg by mouth every morning.     . simvastatin (ZOCOR) 20 MG tablet Take 20 mg by mouth every morning.     . tamsulosin (FLOMAX) 0.4 MG CAPS capsule Take 0.8 mg by mouth daily after breakfast.      No current facility-administered medications for this visit.    Allergies:  Patient has no known allergies.   Social History: The patient  reports that he quit smoking about 34 years ago. His smoking use included  cigarettes. He has a 22.50 pack-year smoking history. He has never used smokeless tobacco. He reports current alcohol use. He reports that he does not use drugs.   ROS:  Please see the history of present illness. Otherwise, complete review of systems is positive for none.  All other systems are reviewed and negative.   Physical Exam: VS:  BP 132/74 (BP Location: Right Arm)   Pulse 73   Ht 5\' 10"  (1.778 m)   Wt 204 lb 12.8 oz (92.9 kg)   SpO2 97%   BMI 29.39 kg/m , BMI Body mass index is 29.39 kg/m.  Wt Readings from Last 3 Encounters:  01/27/19 204 lb 12.8 oz (92.9 kg)  07/12/18 197 lb 8 oz (89.6 kg)  06/24/18 194 lb 8 oz (88.2 kg)    General: Elderly male, appears comfortable at rest. HEENT: Conjunctiva and lids normal, oropharynx clear. Neck: Supple, no elevated JVP or carotid bruits, no thyromegaly. Lungs: Clear to auscultation, nonlabored breathing at rest. Cardiac: Regular rate and rhythm, no S3 or significant systolic murmur, no pericardial rub. Abdomen: Soft, nontender, bowel sounds present. Extremities: No pitting edema, distal pulses 2+. Skin: Warm and dry. Musculoskeletal: No kyphosis. Neuropsychiatric: Alert and oriented x3, affect grossly appropriate.  ECG: I personally reviewed the tracing from 10/26/2017 which showed sinus rhythm with PACs and nonspecific T wave changes.  Recent Labwork: 06/24/2018: ALT 13; AST 21; BUN 19; Creatinine, Ser 0.91; Potassium 4.3; Sodium 139 07/12/2018: Hemoglobin 13.0; Platelets 201   Other Studies Reviewed Today:  Left and right heart catheterization 10/26/2017: 1. No angiographic evidence of CAD 2. Normal filling pressures.   Fick Cardiac Output 6.66 L/min  Fick Cardiac Output Index 3.3 (L/min)/BSA  RA A Wave 9 mmHg  RA V Wave 8 mmHg  RA Mean 7 mmHg  RV Systolic Pressure 28 mmHg  RV Diastolic Pressure 6 mmHg  RV EDP 8 mmHg  PA Systolic Pressure 27 mmHg  PA Diastolic Pressure 9 mmHg  PA Mean 16 mmHg  PW A Wave 10 mmHg  PW  V Wave 8 mmHg  PW Mean 7 mmHg  AO Systolic Pressure 725 mmHg  AO Diastolic Pressure 60 mmHg  AO Mean 79 mmHg  LV Systolic Pressure 366 mmHg  LV Diastolic Pressure 10 mmHg  LV EDP 13 mmHg  Arterial Occlusion Pressure Extended Systolic Pressure 440 mmHg  Arterial Occlusion Pressure Extended Diastolic Pressure 61 mmHg  Arterial Occlusion Pressure Extended Mean Pressure 83 mmHg  Left Ventricular Apex Extended Systolic Pressure 347 mmHg  Left Ventricular Apex Extended Diastolic Pressure 7 mmHg  Left Ventricular Apex Extended EDP Pressure 10 mmHg  QP/QS 1  TPVR Index 4.85 HRUI  TSVR Index 23.98 HRUI  PVR SVR Ratio 0.13  TPVR/TSVR Ratio 0.2   Echocardiogram 08/31/2017: Study Conclusions  - Left ventricle: Diffuse hypokinesis worse in the inferior and posterior lateral walls. The cavity size was mildly dilated. There was mild concentric hypertrophy. Systolic function was mildly reduced. The estimated ejection fraction was in  the range of 45% to 50%. Left ventricular diastolic function parameters were normal. - Aortic valve: There was mild regurgitation. - Left atrium: The atrium was mildly dilated. - Pulmonary arteries: PA peak pressure: 36 mm Hg (S).  Assessment and Plan:  1.  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  No significant coronary artery disease documented at that time by cardiac catheterization with normal filling pressures.  He is symptomatically stable, reports NYHA class II dyspnea.  Plan is to obtain a follow-up echocardiogram for reevaluation prior to considering any medication adjustments.  I did talk with him about a walking regimen.  He will stop aspirin daily.  2.  Essential hypertension, blood pressure is reasonably well-controlled today on Norvasc.  3.  Mixed hyperlipidemia, continues on Zocor with follow-up by Dr. Hilma Favors.  No intolerances.  Current medicines were reviewed with the patient today.   Orders Placed This Encounter    Procedures  . EKG 12-Lead  . ECHOCARDIOGRAM COMPLETE    Disposition: Follow-up in 1 year if LVEF stable or improved.  Signed, Satira Sark, MD, [facility] 01/27/2019 8:42 AM    [facility] at [facility] [address] [place], [place] [address] Phone: [phone]; Fax: [phone]

## 2019-01-27 ENCOUNTER — Encounter: Payer: Self-pay | Admitting: Cardiology

## 2019-01-27 ENCOUNTER — Ambulatory Visit: Payer: Medicare Other | Admitting: Cardiology

## 2019-01-27 VITALS — BP 132/74 | HR 73 | Ht 70.0 in | Wt 204.8 lb

## 2019-01-27 DIAGNOSIS — I10 Essential (primary) hypertension: Secondary | ICD-10-CM

## 2019-01-27 DIAGNOSIS — E782 Mixed hyperlipidemia: Secondary | ICD-10-CM | POA: Diagnosis not present

## 2019-01-27 DIAGNOSIS — I429 Cardiomyopathy, unspecified: Secondary | ICD-10-CM

## 2019-01-27 DIAGNOSIS — I428 Other cardiomyopathies: Secondary | ICD-10-CM

## 2019-01-27 NOTE — Patient Instructions (Signed)
Medication Instructions:  STOP Aspirin  If you need a refill on your cardiac medications before your next appointment, please call your pharmacy.   Lab work: None today If you have labs (blood work) drawn today and your tests are completely normal, you will receive your results only by: Marland Kitchen MyChart Message (if you have MyChart) OR . A paper copy in the mail If you have any lab test that is abnormal or we need to change your treatment, we will call you to review the results.  Testing/Procedures: Your physician has requested that you have an echocardiogram. Echocardiography is a painless test that uses sound waves to create images of your heart. It provides your doctor with information about the size and shape of your heart and how well your heart's chambers and valves are working. This procedure takes approximately one hour. There are no restrictions for this procedure.    Follow-Up: At [facility], you and your health needs are our priority.  As part of our continuing mission to provide you with exceptional heart care, we have created designated Provider Care Teams.  These Care Teams include your primary Cardiologist (physician) and Advanced Practice Providers (APPs -  Physician Assistants and Nurse Practitioners) who all work together to provide you with the care you need, when you need it. You will need a follow up appointment in 1 years.  Please call our office 2 months in advance to schedule this appointment.  You may see Rozann Lesches, MD or one of the following Advanced Practice Providers on your designated Care Team:   Bernerd Pho, PA-C [facility]) . Ermalinda Barrios, PA-C ([place])  Any Other Special Instructions Will Be Listed Below (If Applicable). None

## 2019-02-06 ENCOUNTER — Other Ambulatory Visit: Payer: Self-pay

## 2019-02-06 ENCOUNTER — Ambulatory Visit (HOSPITAL_COMMUNITY)
Admission: RE | Admit: 2019-02-06 | Discharge: 2019-02-06 | Disposition: A | Discharge status: Home / Self Care | Payer: Medicare Other | Source: Ambulatory Visit | Attending: Cardiology | Admitting: Cardiology

## 2019-02-06 DIAGNOSIS — E785 Hyperlipidemia, unspecified: Secondary | ICD-10-CM | POA: Diagnosis not present

## 2019-02-06 DIAGNOSIS — K219 Gastro-esophageal reflux disease without esophagitis: Secondary | ICD-10-CM | POA: Diagnosis not present

## 2019-02-06 DIAGNOSIS — I071 Rheumatic tricuspid insufficiency: Secondary | ICD-10-CM | POA: Diagnosis not present

## 2019-02-06 DIAGNOSIS — I429 Cardiomyopathy, unspecified: Secondary | ICD-10-CM | POA: Insufficient documentation

## 2019-02-06 NOTE — Progress Notes (Signed)
*  PRELIMINARY RESULTS* Echocardiogram 2D Echocardiogram has been performed.  James Mccall 02/06/2019, 10:28 AM

## 2019-02-17 DIAGNOSIS — Z1389 Encounter for screening for other disorder: Secondary | ICD-10-CM | POA: Diagnosis not present

## 2019-02-17 DIAGNOSIS — D1779 Benign lipomatous neoplasm of other sites: Secondary | ICD-10-CM | POA: Diagnosis not present

## 2019-03-14 ENCOUNTER — Other Ambulatory Visit: Payer: Self-pay | Admitting: Family Medicine

## 2019-03-14 ENCOUNTER — Other Ambulatory Visit (HOSPITAL_COMMUNITY): Payer: Self-pay | Admitting: Family Medicine

## 2019-03-14 DIAGNOSIS — D1779 Benign lipomatous neoplasm of other sites: Secondary | ICD-10-CM

## 2019-04-25 ENCOUNTER — Other Ambulatory Visit: Payer: Self-pay

## 2019-04-25 ENCOUNTER — Ambulatory Visit (HOSPITAL_COMMUNITY)
Admission: RE | Admit: 2019-04-25 | Discharge: 2019-04-25 | Disposition: A | Discharge status: Home / Self Care | Payer: Medicare Other | Source: Ambulatory Visit | Attending: Family Medicine | Admitting: Family Medicine

## 2019-04-25 DIAGNOSIS — D1779 Benign lipomatous neoplasm of other sites: Secondary | ICD-10-CM | POA: Insufficient documentation

## 2019-04-25 DIAGNOSIS — N281 Cyst of kidney, acquired: Secondary | ICD-10-CM | POA: Diagnosis not present

## 2019-04-26 DIAGNOSIS — D1779 Benign lipomatous neoplasm of other sites: Secondary | ICD-10-CM | POA: Diagnosis not present

## 2019-04-26 DIAGNOSIS — E7849 Other hyperlipidemia: Secondary | ICD-10-CM | POA: Diagnosis not present

## 2019-04-26 DIAGNOSIS — Z1389 Encounter for screening for other disorder: Secondary | ICD-10-CM | POA: Diagnosis not present

## 2019-04-26 DIAGNOSIS — Z0001 Encounter for general adult medical examination with abnormal findings: Secondary | ICD-10-CM | POA: Diagnosis not present

## 2019-04-26 DIAGNOSIS — Z23 Encounter for immunization: Secondary | ICD-10-CM | POA: Diagnosis not present

## 2019-04-26 DIAGNOSIS — I10 Essential (primary) hypertension: Secondary | ICD-10-CM | POA: Diagnosis not present

## 2019-10-12 ENCOUNTER — Other Ambulatory Visit: Payer: Self-pay

## 2019-10-12 DIAGNOSIS — Z20822 Contact with and (suspected) exposure to covid-19: Secondary | ICD-10-CM

## 2019-10-15 LAB — NOVEL CORONAVIRUS, NAA: SARS-CoV-2, NAA: NOT DETECTED

## 2019-10-23 DIAGNOSIS — E7849 Other hyperlipidemia: Secondary | ICD-10-CM | POA: Diagnosis not present

## 2019-10-23 DIAGNOSIS — I10 Essential (primary) hypertension: Secondary | ICD-10-CM | POA: Diagnosis not present

## 2019-11-03 DIAGNOSIS — R350 Frequency of micturition: Secondary | ICD-10-CM | POA: Diagnosis not present

## 2019-11-03 DIAGNOSIS — R3912 Poor urinary stream: Secondary | ICD-10-CM | POA: Diagnosis not present

## 2019-11-23 DIAGNOSIS — E7849 Other hyperlipidemia: Secondary | ICD-10-CM | POA: Diagnosis not present

## 2019-11-23 DIAGNOSIS — I10 Essential (primary) hypertension: Secondary | ICD-10-CM | POA: Diagnosis not present

## 2019-12-24 DIAGNOSIS — I10 Essential (primary) hypertension: Secondary | ICD-10-CM | POA: Diagnosis not present

## 2019-12-24 DIAGNOSIS — M5412 Radiculopathy, cervical region: Secondary | ICD-10-CM | POA: Diagnosis not present

## 2019-12-24 DIAGNOSIS — E7849 Other hyperlipidemia: Secondary | ICD-10-CM | POA: Diagnosis not present

## 2020-01-21 DIAGNOSIS — E7849 Other hyperlipidemia: Secondary | ICD-10-CM | POA: Diagnosis not present

## 2020-01-21 DIAGNOSIS — M5412 Radiculopathy, cervical region: Secondary | ICD-10-CM | POA: Diagnosis not present

## 2020-01-21 DIAGNOSIS — I10 Essential (primary) hypertension: Secondary | ICD-10-CM | POA: Diagnosis not present

## 2020-02-21 DIAGNOSIS — I10 Essential (primary) hypertension: Secondary | ICD-10-CM | POA: Diagnosis not present

## 2020-02-21 DIAGNOSIS — E7849 Other hyperlipidemia: Secondary | ICD-10-CM | POA: Diagnosis not present

## 2020-02-21 DIAGNOSIS — M5412 Radiculopathy, cervical region: Secondary | ICD-10-CM | POA: Diagnosis not present

## 2020-03-22 DIAGNOSIS — E7849 Other hyperlipidemia: Secondary | ICD-10-CM | POA: Diagnosis not present

## 2020-03-22 DIAGNOSIS — M5412 Radiculopathy, cervical region: Secondary | ICD-10-CM | POA: Diagnosis not present

## 2020-03-22 DIAGNOSIS — I10 Essential (primary) hypertension: Secondary | ICD-10-CM | POA: Diagnosis not present

## 2020-04-09 DIAGNOSIS — Z1389 Encounter for screening for other disorder: Secondary | ICD-10-CM | POA: Diagnosis not present

## 2020-04-09 DIAGNOSIS — Z0000 Encounter for general adult medical examination without abnormal findings: Secondary | ICD-10-CM | POA: Diagnosis not present

## 2020-04-09 DIAGNOSIS — E7849 Other hyperlipidemia: Secondary | ICD-10-CM | POA: Diagnosis not present

## 2020-04-09 DIAGNOSIS — I10 Essential (primary) hypertension: Secondary | ICD-10-CM | POA: Diagnosis not present

## 2020-04-09 DIAGNOSIS — Z6824 Body mass index (BMI) 24.0-24.9, adult: Secondary | ICD-10-CM | POA: Diagnosis not present

## 2020-04-22 DIAGNOSIS — K219 Gastro-esophageal reflux disease without esophagitis: Secondary | ICD-10-CM | POA: Diagnosis not present

## 2020-04-22 DIAGNOSIS — E7849 Other hyperlipidemia: Secondary | ICD-10-CM | POA: Diagnosis not present

## 2020-04-22 DIAGNOSIS — I10 Essential (primary) hypertension: Secondary | ICD-10-CM | POA: Diagnosis not present

## 2020-05-18 ENCOUNTER — Encounter: Payer: Self-pay | Admitting: Cardiology

## 2020-05-18 NOTE — Progress Notes (Signed)
Cardiology Office Note  Date: 05/20/2020   ID: James Mccall, DOB 04-16-1946, MRN [id number]  PCP:  Sharilyn Sites, MD  Cardiologist:  Rozann Lesches, MD Electrophysiologist:  None   Chief Complaint  Patient presents with  . Cardiac follow-up    History of Present Illness: James Mccall is a 74 y.o. male last seen in March 2020.  He is here for a routine follow-up visit.  He tells me that he has been doing well, no limiting exertional symptoms, no chest pain, palpitations, or syncope.  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  Follow-up echocardiogram from March 2020 showed LVEF 50 to 55%, normal RV contraction, no major valvular abnormalities.  I reviewed his medications which are stable and outlined below.  He does not describe any intolerances.  He reports follow-up with lab work per [place] back in March, we will request the results for review.  I personally reviewed his ECG today which shows sinus rhythm with PAC, nonspecific T wave changes.  Past Medical History:  Diagnosis Date  . Arthritis   . 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 BPH with obstruction/lower urinary tract symptoms   . 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 GERD (gastroesophageal reflux disease)   . History of acute pyelonephritis 01/2017  . Hyperlipidemia   . Nonischemic cardiomyopathy ([place])   . Right ureteral stone   . Seasonal allergies     Past Surgical History:  Procedure Laterality Date  . CATARACT EXTRACTION W/PHACO Right 08/15/2015   Procedure: CATARACT EXTRACTION PHACO AND INTRAOCULAR LENS PLACEMENT (IOC);  Surgeon: Tonny Branch, MD;  Location: [facility];  Service: Ophthalmology;  Laterality: Right;  CDE:5.75  . CATARACT EXTRACTION W/PHACO Left 09/02/2015   Procedure: CATARACT EXTRACTION PHACO AND INTRAOCULAR LENS PLACEMENT LEFT EYE CDE=5.93;  Surgeon: Tonny Branch, MD;  Location: [facility];  Service: Ophthalmology;  Laterality: Left;  . COLONOSCOPY N/A 08/28/2013     Procedure: COLONOSCOPY;  Surgeon: Daneil Dolin, MD;  [facility];  Service: Endoscopy;  Laterality: N/A;  9:45  . CYSTOSCOPY W/ URETERAL STENT PLACEMENT Right 01/26/2017   Procedure: CYSTOSCOPY WITH RETROGRADE PYELOGRAM/URETERAL STENT PLACEMENT;  Surgeon: Irine Seal, MD;  Location: [facility];  Service: Urology;  Laterality: Right;  . CYSTOSCOPY WITH URETEROSCOPY AND STENT PLACEMENT Right 03/05/2017   Procedure: cystoscopy with right ureteroscopy and stent exchange ;  Surgeon: Festus Aloe, MD;  Location: [facility];  Service: Urology;  Laterality: Right;  . NASAL SINUS SURGERY  2012  . RIGHT/LEFT HEART CATH AND CORONARY ANGIOGRAPHY N/A 10/26/2017   Procedure: RIGHT/LEFT HEART CATH AND CORONARY ANGIOGRAPHY;  Surgeon: Burnell Blanks, MD;  Location: [place] [facility];  Service: Cardiovascular;  Laterality: N/A;  . ROTATOR CUFF REPAIR Bilateral 1998  . Vocal cord polyps  1990's   Benign    Current Outpatient Medications  Medication Sig Dispense Refill  . amLODipine (NORVASC) 5 MG tablet Take 5 mg by mouth daily.     Marland Kitchen atorvastatin (LIPITOR) 20 MG tablet Take 20 mg by mouth daily.    Marland Kitchen esomeprazole (NEXIUM) 40 MG capsule Take 40 mg by mouth every morning.     . fluticasone (FLONASE) 50 MCG/ACT nasal spray Place 1 spray into the nose as needed for allergies.     Marland Kitchen ipratropium (ATROVENT) 0.03 % nasal spray Place 1 spray into both nostrils 2 (two) times daily as needed.     . meloxicam (MOBIC) 7.5  MG tablet Take 7.5 mg by mouth daily. Takes 2 tabs daily    . montelukast (SINGULAIR) 10 MG tablet Take 10 mg by mouth every morning.     . simvastatin (ZOCOR) 20 MG tablet Take 20 mg by mouth every morning.     . tamsulosin (FLOMAX) 0.4 MG CAPS capsule Take 0.8 mg by mouth daily after breakfast.      No current facility-administered medications for this visit.   Allergies:  Patient has no known allergies.   ROS:  As outlined in history of present illness.  He  reports no other major health changes.  Physical Exam: VS:  BP 120/70   Pulse 69   Ht 5\' 10"  (1.778 m)   Wt 203 lb 3.2 oz (92.2 kg)   SpO2 96%   BMI 29.16 kg/m , BMI Body mass index is 29.16 kg/m.  Wt Readings from Last 3 Encounters:  05/20/20 203 lb 3.2 oz (92.2 kg)  01/27/19 204 lb 12.8 oz (92.9 kg)  07/12/18 197 lb 8 oz (89.6 kg)    General: Patient appears comfortable at rest. HEENT: Conjunctiva and lids normal, wearing a mask. Neck: Supple, no elevated JVP or carotid bruits, no thyromegaly. Lungs: Clear to auscultation, nonlabored breathing at rest. Cardiac: Regular rate and rhythm, no S3 or significant systolic murmur, no pericardial rub. Abdomen: Soft, bowel sounds present, no bruits. Extremities: No pitting edema, distal pulses 2+.  ECG:  An ECG dated 01/27/2019 was personally reviewed today and demonstrated:  Sinus rhythm with inferior Q-wave, rule out old inferior infarct pattern, nonspecific ST-T changes.  Recent Labwork:  No interval lab work for review today.  Other Studies Reviewed Today:  Left and right heart catheterization 10/26/2017: 1. No angiographic evidence of CAD 2. Normal filling pressures.   Fick Cardiac Output 6.66 L/min  Fick Cardiac Output Index 3.3 (L/min)/BSA  RA A Wave 9 mmHg  RA V Wave 8 mmHg  RA Mean 7 mmHg  RV Systolic Pressure 28 mmHg  RV Diastolic Pressure 6 mmHg  RV EDP 8 mmHg  PA Systolic Pressure 27 mmHg  PA Diastolic Pressure 9 mmHg  PA Mean 16 mmHg  PW A Wave 10 mmHg  PW V Wave 8 mmHg  PW Mean 7 mmHg  AO Systolic Pressure 191 mmHg  AO Diastolic Pressure 60 mmHg  AO Mean 79 mmHg  LV Systolic Pressure 478 mmHg  LV Diastolic Pressure 10 mmHg  LV EDP 13 mmHg  Arterial Occlusion Pressure Extended Systolic Pressure 295 mmHg  Arterial Occlusion Pressure Extended Diastolic Pressure 61 mmHg  Arterial Occlusion Pressure Extended Mean Pressure 83 mmHg  Left Ventricular Apex Extended Systolic Pressure 621 mmHg  Left Ventricular  Apex Extended Diastolic Pressure 7 mmHg  Left Ventricular Apex Extended EDP Pressure 10 mmHg  QP/QS 1  TPVR Index 4.85 HRUI  TSVR Index 23.98 HRUI  PVR SVR Ratio 0.13  TPVR/TSVR Ratio 0.2   Echocardiogram 02/06/2019: 1. The left ventricle has low normal systolic function, with an ejection  fraction of 50-55%. The cavity size was normal. Left ventricular diastolic  Doppler parameters are consistent with impaired relaxation.  2. The right ventricle has normal systolic function. The cavity was  normal. There is no increase in right ventricular wall thickness.  3. The mitral valve is grossly normal.  4. The tricuspid valve is grossly normal.  5. The aortic valve is grossly normal Mild thickening of the aortic valve  Aortic valve regurgitation is trivial by color flow Doppler.  6. Poor acoustic  windows limit study LVEF is low normal Possible mild  inferior hypokinesis. Endocardium is not well seen.  Assessment and Plan:  1.  History of nonischemic cardiomyopathy, LVEF 50 to 55% by evaluation last year.  He remains symptomatically stable with NYHA class I-II dyspnea, no change in stamina.  Cardiac catheterization from 2018 revealed no significant CAD.  Would continue observation at this time.  2.  Essential hypertension, currently on Norvasc with normal blood pressure today.  3.  Mixed hyperlipidemia, on Zocor per Dr. Hilma Favors.  Requesting interval lab work.  Medication Adjustments/Labs and Tests Ordered: Current medicines are reviewed at length with the patient today.  Concerns regarding medicines are outlined above.   Tests Ordered: Orders Placed This Encounter  Procedures  . EKG 12-Lead    Medication Changes: No orders of the defined types were placed in this encounter.   Disposition:  Follow up 1 year in the [place] office.  Signed, Satira Sark, MD, [facility] 05/20/2020 9:17 AM    [facility] [place]. [address], [place],  [place] [address] Phone: [phone]; Fax: [phone]

## 2020-05-20 ENCOUNTER — Ambulatory Visit: Payer: Medicare Other | Admitting: Cardiology

## 2020-05-20 ENCOUNTER — Encounter: Payer: Self-pay | Admitting: Cardiology

## 2020-05-20 ENCOUNTER — Other Ambulatory Visit: Payer: Self-pay

## 2020-05-20 VITALS — BP 120/70 | HR 69 | Ht 70.0 in | Wt 203.2 lb

## 2020-05-20 DIAGNOSIS — I428 Other cardiomyopathies: Secondary | ICD-10-CM

## 2020-05-20 DIAGNOSIS — E782 Mixed hyperlipidemia: Secondary | ICD-10-CM

## 2020-05-20 DIAGNOSIS — I10 Essential (primary) hypertension: Secondary | ICD-10-CM | POA: Diagnosis not present

## 2020-05-20 NOTE — Patient Instructions (Signed)
Medication Instructions:  Your physician recommends that you continue on your current medications as directed. Please refer to the Current Medication list given to you today.  *If you need a refill on your cardiac medications before your next appointment, please call your pharmacy*   Lab Work: None today If you have labs (blood work) drawn today and your tests are completely normal, you will receive your results only by: . MyChart Message (if you have MyChart) OR . A paper copy in the mail If you have any lab test that is abnormal or we need to change your treatment, we will call you to review the results.   Testing/Procedures: None today   Follow-Up: At [facility], you and your health needs are our priority.  As part of our continuing mission to provide you with exceptional heart care, we have created designated Provider Care Teams.  These Care Teams include your primary Cardiologist (physician) and Advanced Practice Providers (APPs -  Physician Assistants and Nurse Practitioners) who all work together to provide you with the care you need, when you need it.  We recommend signing up for the patient portal called "MyChart".  Sign up information is provided on this After Visit Summary.  MyChart is used to connect with patients for Virtual Visits (Telemedicine).  Patients are able to view lab/test results, encounter notes, upcoming appointments, etc.  Non-urgent messages can be sent to your provider as well.   To learn more about what you can do with MyChart, go to [email or web address].    Your next appointment:   12 month(s)  The format for your next appointment:   In Person  Provider:   Samuel McDowell, MD   Other Instructions None       Thank you for choosing [facility] !         

## 2020-05-22 DIAGNOSIS — I10 Essential (primary) hypertension: Secondary | ICD-10-CM | POA: Diagnosis not present

## 2020-05-22 DIAGNOSIS — K219 Gastro-esophageal reflux disease without esophagitis: Secondary | ICD-10-CM | POA: Diagnosis not present

## 2020-05-22 DIAGNOSIS — E7849 Other hyperlipidemia: Secondary | ICD-10-CM | POA: Diagnosis not present

## 2020-06-21 DIAGNOSIS — I10 Essential (primary) hypertension: Secondary | ICD-10-CM | POA: Diagnosis not present

## 2020-06-21 DIAGNOSIS — E7849 Other hyperlipidemia: Secondary | ICD-10-CM | POA: Diagnosis not present

## 2020-06-21 DIAGNOSIS — K219 Gastro-esophageal reflux disease without esophagitis: Secondary | ICD-10-CM | POA: Diagnosis not present

## 2020-07-23 DIAGNOSIS — E7849 Other hyperlipidemia: Secondary | ICD-10-CM | POA: Diagnosis not present

## 2020-07-23 DIAGNOSIS — K219 Gastro-esophageal reflux disease without esophagitis: Secondary | ICD-10-CM | POA: Diagnosis not present

## 2020-07-23 DIAGNOSIS — I10 Essential (primary) hypertension: Secondary | ICD-10-CM | POA: Diagnosis not present

## 2020-08-13 DIAGNOSIS — R3121 Asymptomatic microscopic hematuria: Secondary | ICD-10-CM | POA: Diagnosis not present

## 2020-08-13 DIAGNOSIS — R350 Frequency of micturition: Secondary | ICD-10-CM | POA: Diagnosis not present

## 2020-08-13 DIAGNOSIS — N3000 Acute cystitis without hematuria: Secondary | ICD-10-CM | POA: Diagnosis not present

## 2020-08-22 DIAGNOSIS — I10 Essential (primary) hypertension: Secondary | ICD-10-CM | POA: Diagnosis not present

## 2020-08-22 DIAGNOSIS — E7849 Other hyperlipidemia: Secondary | ICD-10-CM | POA: Diagnosis not present

## 2020-08-22 DIAGNOSIS — M5412 Radiculopathy, cervical region: Secondary | ICD-10-CM | POA: Diagnosis not present

## 2020-09-18 ENCOUNTER — Encounter: Payer: Self-pay | Admitting: Internal Medicine

## 2020-09-21 DIAGNOSIS — M5412 Radiculopathy, cervical region: Secondary | ICD-10-CM | POA: Diagnosis not present

## 2020-09-21 DIAGNOSIS — I10 Essential (primary) hypertension: Secondary | ICD-10-CM | POA: Diagnosis not present

## 2020-09-21 DIAGNOSIS — E7849 Other hyperlipidemia: Secondary | ICD-10-CM | POA: Diagnosis not present

## 2020-09-25 DIAGNOSIS — N281 Cyst of kidney, acquired: Secondary | ICD-10-CM | POA: Diagnosis not present

## 2020-09-25 DIAGNOSIS — R3915 Urgency of urination: Secondary | ICD-10-CM | POA: Diagnosis not present

## 2020-11-26 DIAGNOSIS — D1779 Benign lipomatous neoplasm of other sites: Secondary | ICD-10-CM | POA: Diagnosis not present

## 2020-11-26 DIAGNOSIS — Z1389 Encounter for screening for other disorder: Secondary | ICD-10-CM | POA: Diagnosis not present

## 2020-11-26 DIAGNOSIS — Z0001 Encounter for general adult medical examination with abnormal findings: Secondary | ICD-10-CM | POA: Diagnosis not present

## 2020-11-26 DIAGNOSIS — I10 Essential (primary) hypertension: Secondary | ICD-10-CM | POA: Diagnosis not present

## 2020-11-26 DIAGNOSIS — E663 Overweight: Secondary | ICD-10-CM | POA: Diagnosis not present

## 2020-11-26 DIAGNOSIS — Z6825 Body mass index (BMI) 25.0-25.9, adult: Secondary | ICD-10-CM | POA: Diagnosis not present

## 2020-11-26 DIAGNOSIS — Z1331 Encounter for screening for depression: Secondary | ICD-10-CM | POA: Diagnosis not present

## 2020-11-26 DIAGNOSIS — E7849 Other hyperlipidemia: Secondary | ICD-10-CM | POA: Diagnosis not present

## 2020-11-26 DIAGNOSIS — R7309 Other abnormal glucose: Secondary | ICD-10-CM | POA: Diagnosis not present

## 2020-12-20 ENCOUNTER — Ambulatory Visit: Payer: Self-pay | Admitting: General Surgery

## 2020-12-20 DIAGNOSIS — D171 Benign lipomatous neoplasm of skin and subcutaneous tissue of trunk: Secondary | ICD-10-CM | POA: Diagnosis not present

## 2021-02-19 DIAGNOSIS — I10 Essential (primary) hypertension: Secondary | ICD-10-CM | POA: Diagnosis not present

## 2021-02-19 DIAGNOSIS — E7849 Other hyperlipidemia: Secondary | ICD-10-CM | POA: Diagnosis not present

## 2021-03-22 DIAGNOSIS — I10 Essential (primary) hypertension: Secondary | ICD-10-CM | POA: Diagnosis not present

## 2021-03-22 DIAGNOSIS — E7849 Other hyperlipidemia: Secondary | ICD-10-CM | POA: Diagnosis not present

## 2021-03-27 ENCOUNTER — Ambulatory Visit: Payer: Medicare Other | Admitting: Cardiology

## 2021-04-01 ENCOUNTER — Ambulatory Visit: Payer: Medicare Other | Admitting: Cardiology

## 2021-04-16 NOTE — Progress Notes (Signed)
Cardiology Office Note  Date: 04/17/2021   ID: James Mccall Dec 27, 1945, MRN [id number]  PCP:  James Sites, MD  Cardiologist:  James Lesches, MD Electrophysiologist:  None   Chief Complaint  Patient presents with  . Cardiac follow-up    History of Present Illness: James Mccall is a 75 y.o. male last seen in June 2021.  He is here for a routine visit.  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  He does admit that he has had to slow down over the last few years, more short of breath with activity.  No orthopnea or PND, no exertional chest pain.  I reviewed his medications which are outlined below.  He anticipates follow-up with PCP this summer.  Blood pressure today is very well controlled.  His last LDL was 88 on Zocor.  I personally reviewed his ECG today which shows sinus rhythm with LVH, rule out old inferior infarct pattern.  Echocardiogram from 2020 revealed LVEF 50 to 55% range.  Past Medical History:  Diagnosis Date  . Arthritis   . 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 BPH with obstruction/lower urinary tract symptoms   . 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 GERD (gastroesophageal reflux disease)   . History of acute pyelonephritis 01/2017  . Hyperlipidemia   . Nonischemic cardiomyopathy ([place])   . Right ureteral stone   . Seasonal allergies     Past Surgical History:  Procedure Laterality Date  . CATARACT EXTRACTION W/PHACO Right 08/15/2015   Procedure: CATARACT EXTRACTION PHACO AND INTRAOCULAR LENS PLACEMENT (IOC);  Surgeon: James Branch, MD;  [facility];  Service: Ophthalmology;  Laterality: Right;  CDE:5.75  . CATARACT EXTRACTION W/PHACO Left 09/02/2015   Procedure: CATARACT EXTRACTION PHACO AND INTRAOCULAR LENS PLACEMENT LEFT EYE CDE=5.93;  Surgeon: James Branch, MD;  [facility];  Service: Ophthalmology;  Laterality: Left;  . COLONOSCOPY N/A 08/28/2013   Procedure: COLONOSCOPY;  Surgeon:  James Dolin, MD;  [facility];  Service: Endoscopy;  Laterality: N/A;  9:45  . CYSTOSCOPY W/ URETERAL STENT PLACEMENT Right 01/26/2017   Procedure: CYSTOSCOPY WITH RETROGRADE PYELOGRAM/URETERAL STENT PLACEMENT;  Surgeon: James Seal, MD;  [facility];  Service: Urology;  Laterality: Right;  . CYSTOSCOPY WITH URETEROSCOPY AND STENT PLACEMENT Right 03/05/2017   Procedure: cystoscopy with right ureteroscopy and stent exchange ;  Surgeon: James Aloe, MD;  Location: [facility];  Service: Urology;  Laterality: Right;  . NASAL SINUS SURGERY  2012  . RIGHT/LEFT HEART CATH AND CORONARY ANGIOGRAPHY N/A 10/26/2017   Procedure: RIGHT/LEFT HEART CATH AND CORONARY ANGIOGRAPHY;  Surgeon: James Blanks, MD;  Location: [place] [facility];  Service: Cardiovascular;  Laterality: N/A;  . ROTATOR CUFF REPAIR Bilateral 1998  . Vocal cord polyps  1990's   Benign    Current Outpatient Medications  Medication Sig Dispense Refill  . amLODipine (NORVASC) 5 MG tablet Take 5 mg by mouth daily.     . fluticasone (FLONASE) 50 MCG/ACT nasal spray Place 1 spray into the nose as needed for allergies.     Marland Kitchen ipratropium (ATROVENT) 0.03 % nasal spray Place 1 spray into both nostrils 2 (two) times daily as needed.     . meloxicam (MOBIC) 7.5 MG tablet Take 7.5 mg by mouth daily. Takes 2 tabs daily    . montelukast (SINGULAIR) 10 MG tablet Take 10 mg by mouth every morning.     Marland Kitchen  pantoprazole (PROTONIX) 40 MG tablet Take 1 tablet by mouth daily.    . simvastatin (ZOCOR) 20 MG tablet Take 20 mg by mouth every morning.    . tadalafil (CIALIS) 5 MG tablet Take 5 mg by mouth at bedtime.    . tamsulosin (FLOMAX) 0.4 MG CAPS capsule Take 0.8 mg by mouth daily after breakfast.     No current facility-administered medications for this visit.   Allergies:  Patient has no known allergies.   ROS: No palpitations or syncope.  Physical Exam: VS:  BP 118/74   Pulse 84   Ht 5\' 9"  (1.753 m)    Wt 198 lb (89.8 kg)   SpO2 97%   BMI 29.24 kg/m , BMI Body mass index is 29.24 kg/m.  Wt Readings from Last 3 Encounters:  04/17/21 198 lb (89.8 kg)  05/20/20 203 lb 3.2 oz (92.2 kg)  01/27/19 204 lb 12.8 oz (92.9 kg)    General: Patient appears comfortable at rest. HEENT: Conjunctiva and lids normal, wearing a mask. Neck: Supple, no elevated JVP or carotid bruits, no thyromegaly. Lungs: Clear to auscultation, nonlabored breathing at rest. Cardiac: Regular rate and rhythm, S4, no S3 or significant systolic murmur, no pericardial rub. Extremities: No pitting edema.  ECG:  An ECG dated 05/20/2020 was personally reviewed today and demonstrated:  Sinus rhythm with PAC and nonspecific T wave changes.  Recent Labwork:  May 2021: BUN 24, creatinine 1.11, potassium 4.5, AST 33, ALT 18, cholesterol 154, triglycerides 62, HDL 53, LDL 88  Other Studies Reviewed Today:  Echocardiogram 02/06/2019: 1. The left ventricle has low normal systolic function, with an ejection  fraction of 50-55%. The cavity size was normal. Left ventricular diastolic  Doppler parameters are consistent with impaired relaxation.  2. The right ventricle has normal systolic function. The cavity was  normal. There is no increase in right ventricular wall thickness.  3. The mitral valve is grossly normal.  4. The tricuspid valve is grossly normal.  5. The aortic valve is grossly normal Mild thickening of the aortic valve  Aortic valve regurgitation is trivial by color flow Doppler.  6. Poor acoustic windows limit study LVEF is low normal Possible mild  inferior hypokinesis. Endocardium is not well seen.  Assessment and Plan:  1.  History of nonischemic cardiomyopathy.  LVEF 50 to 55% range by echocardiogram in 2020.  Due to interval shortness of breath with activity and decreased stamina we will obtain a follow-up echocardiogram for reevaluation.  ECG reviewed, inferior Q waves noted but previous cardiac  catheterization demonstrated no significant CAD in 2018.  2.  Essential hypertension, blood pressure well controlled today on Norvasc.  3.  Mixed hyperlipidemia, on Zocor with last LDL 88.  Keep follow-up with Dr. Hilma Mccall.  Medication Adjustments/Labs and Tests Ordered: Current medicines are reviewed at length with the patient today.  Concerns regarding medicines are outlined above.   Tests Ordered: Orders Placed This Encounter  Procedures  . ECHOCARDIOGRAM COMPLETE    Medication Changes: No orders of the defined types were placed in this encounter.   Disposition:  Follow up 1 year.  Signed, Satira Sark, MD, [place] 04/17/2021 8:38 AM    [facility] at [facility] [address] [place], [place] [address] Phone: [phone]; Fax: [phone]

## 2021-04-17 ENCOUNTER — Encounter (INDEPENDENT_AMBULATORY_CARE_PROVIDER_SITE_OTHER): Payer: Self-pay

## 2021-04-17 ENCOUNTER — Other Ambulatory Visit: Payer: Self-pay

## 2021-04-17 ENCOUNTER — Ambulatory Visit: Payer: Medicare HMO | Admitting: Cardiology

## 2021-04-17 ENCOUNTER — Encounter: Payer: Self-pay | Admitting: Cardiology

## 2021-04-17 VITALS — BP 118/74 | HR 84 | Ht 69.0 in | Wt 198.0 lb

## 2021-04-17 DIAGNOSIS — I10 Essential (primary) hypertension: Secondary | ICD-10-CM | POA: Diagnosis not present

## 2021-04-17 DIAGNOSIS — I428 Other cardiomyopathies: Secondary | ICD-10-CM | POA: Diagnosis not present

## 2021-04-17 DIAGNOSIS — E782 Mixed hyperlipidemia: Secondary | ICD-10-CM | POA: Diagnosis not present

## 2021-04-17 NOTE — Patient Instructions (Signed)
Medication Instructions:  Your physician recommends that you continue on your current medications as directed. Please refer to the Current Medication list given to you today.  *If you need a refill on your cardiac medications before your next appointment, please call your pharmacy*   Lab Work: None today  If you have labs (blood work) drawn today and your tests are completely normal, you will receive your results only by: Marland Kitchen MyChart Message (if you have MyChart) OR . A paper copy in the mail If you have any lab test that is abnormal or we need to change your treatment, we will call you to review the results.   Testing/Procedures: Your physician has requested that you have an echocardiogram. Echocardiography is a painless test that uses sound waves to create images of your heart. It provides your doctor with information about the size and shape of your heart and how well your heart's chambers and valves are working. This procedure takes approximately one hour. There are no restrictions for this procedure.     Follow-Up: At [facility], you and your health needs are our priority.  As part of our continuing mission to provide you with exceptional heart care, we have created designated Provider Care Teams.  These Care Teams include your primary Cardiologist (physician) and Advanced Practice Providers (APPs -  Physician Assistants and Nurse Practitioners) who all work together to provide you with the care you need, when you need it.  We recommend signing up for the patient portal called "MyChart".  Sign up information is provided on this After Visit Summary.  MyChart is used to connect with patients for Virtual Visits (Telemedicine).  Patients are able to view lab/test results, encounter notes, upcoming appointments, etc.  Non-urgent messages can be sent to your provider as well.   To learn more about what you can do with MyChart, go to [email or web address].    Your next appointment:   12  month(s)  The format for your next appointment:   In Person  Provider:   Rozann Lesches, MD   Other Instructions None

## 2021-04-17 NOTE — Addendum Note (Signed)
Addended by: Barbarann Ehlers A on: 04/17/2021 11:01 AM   Modules accepted: Orders

## 2021-04-18 ENCOUNTER — Ambulatory Visit (HOSPITAL_COMMUNITY)
Admission: RE | Admit: 2021-04-18 | Discharge: 2021-04-18 | Disposition: A | Discharge status: Home / Self Care | Payer: Medicare HMO | Source: Ambulatory Visit | Attending: Family Medicine | Admitting: Family Medicine

## 2021-04-18 ENCOUNTER — Other Ambulatory Visit: Payer: Self-pay

## 2021-04-18 DIAGNOSIS — I428 Other cardiomyopathies: Secondary | ICD-10-CM | POA: Diagnosis not present

## 2021-04-18 LAB — ECHOCARDIOGRAM COMPLETE
Area-P 1/2: 2.08 cm2
S' Lateral: 3.4 cm

## 2021-04-18 NOTE — Progress Notes (Signed)
*  PRELIMINARY RESULTS* Echocardiogram 2D Echocardiogram has been performed.  James Mccall 04/18/2021, 11:14 AM

## 2021-04-22 DIAGNOSIS — K219 Gastro-esophageal reflux disease without esophagitis: Secondary | ICD-10-CM | POA: Diagnosis not present

## 2021-04-22 DIAGNOSIS — N400 Enlarged prostate without lower urinary tract symptoms: Secondary | ICD-10-CM | POA: Diagnosis not present

## 2021-04-22 DIAGNOSIS — E785 Hyperlipidemia, unspecified: Secondary | ICD-10-CM | POA: Diagnosis not present

## 2021-04-22 DIAGNOSIS — R32 Unspecified urinary incontinence: Secondary | ICD-10-CM | POA: Diagnosis not present

## 2021-04-22 DIAGNOSIS — M1990 Unspecified osteoarthritis, unspecified site: Secondary | ICD-10-CM | POA: Diagnosis not present

## 2021-04-22 DIAGNOSIS — I10 Essential (primary) hypertension: Secondary | ICD-10-CM | POA: Diagnosis not present

## 2021-04-22 DIAGNOSIS — G8929 Other chronic pain: Secondary | ICD-10-CM | POA: Diagnosis not present

## 2021-04-22 DIAGNOSIS — N529 Male erectile dysfunction, unspecified: Secondary | ICD-10-CM | POA: Diagnosis not present

## 2021-04-22 DIAGNOSIS — J309 Allergic rhinitis, unspecified: Secondary | ICD-10-CM | POA: Diagnosis not present

## 2021-04-22 DIAGNOSIS — E669 Obesity, unspecified: Secondary | ICD-10-CM | POA: Diagnosis not present

## 2021-05-22 DIAGNOSIS — I10 Essential (primary) hypertension: Secondary | ICD-10-CM | POA: Diagnosis not present

## 2021-05-22 DIAGNOSIS — E7849 Other hyperlipidemia: Secondary | ICD-10-CM | POA: Diagnosis not present

## 2021-06-11 DIAGNOSIS — N342 Other urethritis: Secondary | ICD-10-CM | POA: Diagnosis not present

## 2021-06-11 DIAGNOSIS — Z6825 Body mass index (BMI) 25.0-25.9, adult: Secondary | ICD-10-CM | POA: Diagnosis not present

## 2021-06-11 DIAGNOSIS — E663 Overweight: Secondary | ICD-10-CM | POA: Diagnosis not present

## 2021-11-21 DIAGNOSIS — I10 Essential (primary) hypertension: Secondary | ICD-10-CM | POA: Diagnosis not present

## 2021-11-21 DIAGNOSIS — E782 Mixed hyperlipidemia: Secondary | ICD-10-CM | POA: Diagnosis not present

## 2021-11-28 DIAGNOSIS — Z6825 Body mass index (BMI) 25.0-25.9, adult: Secondary | ICD-10-CM | POA: Diagnosis not present

## 2021-11-28 DIAGNOSIS — D1779 Benign lipomatous neoplasm of other sites: Secondary | ICD-10-CM | POA: Diagnosis not present

## 2021-11-28 DIAGNOSIS — N400 Enlarged prostate without lower urinary tract symptoms: Secondary | ICD-10-CM | POA: Diagnosis not present

## 2021-11-28 DIAGNOSIS — Z1331 Encounter for screening for depression: Secondary | ICD-10-CM | POA: Diagnosis not present

## 2021-11-28 DIAGNOSIS — E782 Mixed hyperlipidemia: Secondary | ICD-10-CM | POA: Diagnosis not present

## 2021-11-28 DIAGNOSIS — Z0000 Encounter for general adult medical examination without abnormal findings: Secondary | ICD-10-CM | POA: Diagnosis not present

## 2021-11-28 DIAGNOSIS — E7849 Other hyperlipidemia: Secondary | ICD-10-CM | POA: Diagnosis not present

## 2021-11-28 DIAGNOSIS — M5412 Radiculopathy, cervical region: Secondary | ICD-10-CM | POA: Diagnosis not present

## 2021-11-28 DIAGNOSIS — E663 Overweight: Secondary | ICD-10-CM | POA: Diagnosis not present

## 2021-11-28 DIAGNOSIS — I10 Essential (primary) hypertension: Secondary | ICD-10-CM | POA: Diagnosis not present

## 2022-01-07 ENCOUNTER — Encounter: Payer: Self-pay | Admitting: *Deleted

## 2022-01-21 DIAGNOSIS — Z791 Long term (current) use of non-steroidal anti-inflammatories (NSAID): Secondary | ICD-10-CM | POA: Diagnosis not present

## 2022-01-21 DIAGNOSIS — E785 Hyperlipidemia, unspecified: Secondary | ICD-10-CM | POA: Diagnosis not present

## 2022-01-21 DIAGNOSIS — I10 Essential (primary) hypertension: Secondary | ICD-10-CM | POA: Diagnosis not present

## 2022-01-21 DIAGNOSIS — E669 Obesity, unspecified: Secondary | ICD-10-CM | POA: Diagnosis not present

## 2022-01-21 DIAGNOSIS — K219 Gastro-esophageal reflux disease without esophagitis: Secondary | ICD-10-CM | POA: Diagnosis not present

## 2022-01-21 DIAGNOSIS — Z008 Encounter for other general examination: Secondary | ICD-10-CM | POA: Diagnosis not present

## 2022-01-21 DIAGNOSIS — G8929 Other chronic pain: Secondary | ICD-10-CM | POA: Diagnosis not present

## 2022-01-21 DIAGNOSIS — J309 Allergic rhinitis, unspecified: Secondary | ICD-10-CM | POA: Diagnosis not present

## 2022-01-21 DIAGNOSIS — Z825 Family history of asthma and other chronic lower respiratory diseases: Secondary | ICD-10-CM | POA: Diagnosis not present

## 2022-01-21 DIAGNOSIS — Z8249 Family history of ischemic heart disease and other diseases of the circulatory system: Secondary | ICD-10-CM | POA: Diagnosis not present

## 2022-01-21 DIAGNOSIS — Z6831 Body mass index (BMI) 31.0-31.9, adult: Secondary | ICD-10-CM | POA: Diagnosis not present

## 2022-01-21 DIAGNOSIS — N400 Enlarged prostate without lower urinary tract symptoms: Secondary | ICD-10-CM | POA: Diagnosis not present

## 2022-01-21 DIAGNOSIS — Z809 Family history of malignant neoplasm, unspecified: Secondary | ICD-10-CM | POA: Diagnosis not present

## 2022-02-13 DIAGNOSIS — U071 COVID-19: Secondary | ICD-10-CM | POA: Diagnosis not present

## 2022-03-09 ENCOUNTER — Ambulatory Visit (INDEPENDENT_AMBULATORY_CARE_PROVIDER_SITE_OTHER): Payer: Self-pay | Admitting: *Deleted

## 2022-03-09 VITALS — Ht 68.5 in | Wt 197.0 lb

## 2022-03-09 DIAGNOSIS — Z86010 Personal history of colonic polyps: Secondary | ICD-10-CM

## 2022-03-09 NOTE — Progress Notes (Addendum)
Gastroenterology Pre-Procedure Review ? ?Request Date: 03/09/2022 ?Requesting Physician: Dr. Hilma Favors, Last TCS 08/28/2013 done by Dr. 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 ?PATIENT REVIEW QUESTIONS: The patient responded to the following health history questions as indicated:   ? ?1. Diabetes Melitis: no ?2. Joint replacements in the past 12 months: no ?3. Major health problems in the past 3 months: yes, Covid 2 weeks ago ?4. Has an artificial valve or MVP: no ?5. Has a defibrillator: no ?6. Has been advised in past to take antibiotics in advance of a procedure like teeth cleaning: no ?7. Family history of colon cancer:  no ?8. Alcohol Use: yes, 2-3 drinks a week ?9. Illicit drug Use: no ?10. History of sleep apnea: no  ?11. History of coronary artery or other vascular stents placed within the last 12 months: no ?12. History of any prior anesthesia complications: no ?13. Body mass index is 29.52 kg/m?. ?   ?MEDICATIONS & ALLERGIES:    ?Patient reports the following regarding taking any blood thinners:   ?Plavix? no ?Aspirin? no ?Coumadin? no ?Brilinta? no ?Xarelto? no ?Eliquis? no ?Pradaxa? no ?Savaysa? no ?Effient? no ? ?Patient confirms/reports the following medications:  ?Current Outpatient Medications  ?Medication Sig Dispense Refill  ? amLODipine (NORVASC) 5 MG tablet Take 5 mg by mouth daily.     ? Ascorbic Acid (VITAMIN C PO) Take by mouth daily at 6 (six) AM.    ? fluticasone (FLONASE) 50 MCG/ACT nasal spray Place 1 spray into the nose as needed for allergies.     ? ipratropium (ATROVENT) 0.03 % nasal spray Place 1 spray into both nostrils 2 (two) times daily as needed.     ? meloxicam (MOBIC) 7.5 MG tablet Take 7.5 mg by mouth daily. Takes 2 tabs daily    ? montelukast (SINGULAIR) 10 MG tablet Take 10 mg by mouth every morning.     ? Multiple Vitamins-Minerals (MENS 50+ MULTIVITAMIN) TABS Take by mouth daily at 6 (six) AM.    ? Multiple Vitamins-Minerals (ZINC PO) Take by mouth daily at 6 (six)  AM.    ? pantoprazole (PROTONIX) 40 MG tablet Take 1 tablet by mouth daily.    ? simvastatin (ZOCOR) 20 MG tablet Take 20 mg by mouth every morning.    ? tadalafil (CIALIS) 5 MG tablet Take 5 mg by mouth as needed.    ? tamsulosin (FLOMAX) 0.4 MG CAPS capsule Take 0.8 mg by mouth daily after breakfast.    ? ?No current facility-administered medications for this visit.  ? ? ?Patient confirms/reports the following allergies:  ?No Known Allergies ? ?No orders of the defined types were placed in this encounter. ? ? ?AUTHORIZATION INFORMATION ?Primary Insurance: [place],  [place] #: [id number],  Group #: [id number] [place] ?Pre-Cert / Josem Kaufmann required: No, not required ? ?SCHEDULE INFORMATION: ?Procedure has been scheduled as follows:  ?Date: 03/30/2022 , Time: 2:00 ?Location: [facility] with Dr. Gala Romney ? ?This Gastroenterology Pre-Precedure Review Form is being routed to the following provider(s): Neil Crouch, PA-C ?  ?

## 2022-03-10 NOTE — Progress Notes (Signed)
Ok to schedule. May consider waiting couple of weeks given recent covid. ASA 2. ?

## 2022-03-10 NOTE — Progress Notes (Addendum)
Tried to call pt.  Voice mail full and could not accept any messages. ?

## 2022-03-12 ENCOUNTER — Encounter: Payer: Self-pay | Admitting: *Deleted

## 2022-03-12 MED ORDER — PEG 3350-KCL-NA BICARB-NACL 420 G PO SOLR: 4000.0000 mL | Freq: Once | ORAL | 0 refills | Status: AC

## 2022-03-12 NOTE — Addendum Note (Signed)
Addended by: Metro Kung on: 03/12/2022 10:25 AM ? ? Modules accepted: Orders ? ?

## 2022-03-12 NOTE — Progress Notes (Signed)
Spoke to pt.  Scheduled procedure for 03/30/2022 at 2:00, arrival 12:30 at [facility].  Reviewed prep instructions with pt by phone.  Pt made aware to pick up prep kit and OTC items at his pharmacy.  Pt made aware that I am mailing out prep instructions.  Confirmed mailing address. ?

## 2022-03-30 ENCOUNTER — Encounter (HOSPITAL_COMMUNITY)
Admission: RE | Disposition: A | Discharge status: Home / Self Care | Payer: Self-pay | Source: Home / Self Care | Attending: Internal Medicine

## 2022-03-30 ENCOUNTER — Ambulatory Visit (HOSPITAL_COMMUNITY)
Admission: RE | Admit: 2022-03-30 | Discharge: 2022-03-30 | Disposition: A | Discharge status: Home / Self Care | Payer: Medicare HMO | Attending: Internal Medicine | Admitting: Internal Medicine

## 2022-03-30 ENCOUNTER — Ambulatory Visit (HOSPITAL_COMMUNITY): Payer: Medicare HMO | Admitting: Anesthesiology

## 2022-03-30 ENCOUNTER — Encounter (HOSPITAL_COMMUNITY): Payer: Self-pay | Admitting: Internal Medicine

## 2022-03-30 ENCOUNTER — Ambulatory Visit (HOSPITAL_BASED_OUTPATIENT_CLINIC_OR_DEPARTMENT_OTHER): Payer: Medicare HMO | Admitting: Anesthesiology

## 2022-03-30 ENCOUNTER — Other Ambulatory Visit: Payer: Self-pay

## 2022-03-30 DIAGNOSIS — Z87891 Personal history of nicotine dependence: Secondary | ICD-10-CM | POA: Diagnosis not present

## 2022-03-30 DIAGNOSIS — D649 Anemia, unspecified: Secondary | ICD-10-CM | POA: Diagnosis not present

## 2022-03-30 DIAGNOSIS — K5730 Diverticulosis of large intestine without perforation or abscess without bleeding: Secondary | ICD-10-CM

## 2022-03-30 DIAGNOSIS — Z1211 Encounter for screening for malignant neoplasm of colon: Secondary | ICD-10-CM | POA: Diagnosis not present

## 2022-03-30 DIAGNOSIS — Z86010 Personal history of colonic polyps: Secondary | ICD-10-CM | POA: Diagnosis not present

## 2022-03-30 DIAGNOSIS — K219 Gastro-esophageal reflux disease without esophagitis: Secondary | ICD-10-CM | POA: Diagnosis not present

## 2022-03-30 DIAGNOSIS — D123 Benign neoplasm of transverse colon: Secondary | ICD-10-CM | POA: Insufficient documentation

## 2022-03-30 DIAGNOSIS — K635 Polyp of colon: Secondary | ICD-10-CM

## 2022-03-30 DIAGNOSIS — D124 Benign neoplasm of descending colon: Secondary | ICD-10-CM | POA: Diagnosis not present

## 2022-03-30 HISTORY — PX: COLONOSCOPY WITH PROPOFOL: SHX5780

## 2022-03-30 HISTORY — PX: POLYPECTOMY: SHX149

## 2022-03-30 SURGERY — COLONOSCOPY WITH PROPOFOL
Anesthesia: General

## 2022-03-30 MED ORDER — PROPOFOL 500 MG/50ML IV EMUL
INTRAVENOUS | Status: DC | PRN
  Administered 2022-03-30: 150 ug/kg/min via INTRAVENOUS

## 2022-03-30 MED ORDER — PROPOFOL 10 MG/ML IV BOLUS
INTRAVENOUS | Status: AC
  Filled 2022-03-30: qty 20

## 2022-03-30 MED ORDER — LIDOCAINE HCL 1 % IJ SOLN
INTRAMUSCULAR | Status: DC | PRN
  Administered 2022-03-30: 50 mg via INTRADERMAL

## 2022-03-30 MED ORDER — PROPOFOL 10 MG/ML IV BOLUS
INTRAVENOUS | Status: DC | PRN
  Administered 2022-03-30: 50 mg via INTRAVENOUS
  Administered 2022-03-30: 5 mg via INTRAVENOUS

## 2022-03-30 MED ORDER — LACTATED RINGERS IV SOLN: INTRAVENOUS | Status: DC

## 2022-03-30 NOTE — Transfer of Care (Signed)
Immediate Anesthesia Transfer of Care Note ? ?Patient: James Mccall ? ?Procedure(s) Performed: COLONOSCOPY WITH PROPOFOL ?POLYPECTOMY INTESTINAL ? ?Patient Location: PACU ? ?Anesthesia Type:General ? ?Level of Consciousness: awake ? ?Airway & Oxygen Therapy: Patient Spontanous Breathing ? ?Post-op Assessment: Report given to RN ? ?Post vital signs: Reviewed and stable ? ?Last Vitals:  ?Vitals Value Taken Time  ?BP 85/41 03/30/22 1341  ?Temp 36.6 ?C 03/30/22 1341  ?Pulse 61 03/30/22 1341  ?Resp 18 03/30/22 1341  ?SpO2 95 % 03/30/22 1341  ? ? ?Last Pain:  ?Vitals:  ? 03/30/22 1341  ?TempSrc: Oral  ?PainSc: 0-No pain  ?   ? ?Patients Stated Pain Goal: 5 (03/30/22 1213) ? ?Complications: No notable events documented. ?

## 2022-03-30 NOTE — Anesthesia Postprocedure Evaluation (Signed)
Anesthesia Post Note ? ?Patient: James Mccall ? ?Procedure(s) Performed: COLONOSCOPY WITH PROPOFOL ?POLYPECTOMY INTESTINAL ? ?Patient location during evaluation: Endoscopy ?Anesthesia Type: General ?Level of consciousness: awake and alert ?Pain management: pain level controlled ?Vital Signs Assessment: post-procedure vital signs reviewed and stable ?Respiratory status: spontaneous breathing ?Cardiovascular status: blood pressure returned to baseline and stable ?Postop Assessment: no apparent nausea or vomiting ?Anesthetic complications: no ? ? ?No notable events documented. ? ? ?Last Vitals:  ?Vitals:  ? 03/30/22 1213 03/30/22 1341  ?BP: 135/71 (!) 85/41  ?Pulse: 73 61  ?Resp: 20 18  ?Temp: 37.1 ?C 36.6 ?C  ?SpO2: 98% 95%  ?  ?Last Pain:  ?Vitals:  ? 03/30/22 1341  ?TempSrc: Oral  ?PainSc: 0-No pain  ? ? ?  ?  ?  ?  ?  ?  ? ?Ayaz Sondgeroth ? ? ? ? ?

## 2022-03-30 NOTE — H&P (Signed)
$'@LOGO'Q$ @ ? ? ?Primary Care Physician:  Sharilyn Sites, MD ?Primary Gastroenterologist:  Dr. Gala Romney ? ?Pre-Procedure History & Physical: ?HPI:  James Mccall is a 76 y.o. male here for a surveillance colonoscopy.  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  No bowel symptoms at this time. ? ?Past Medical History:  ?Diagnosis Date  ? Arthritis   ? Beta thalassemia minor   ? Followed by hematology  ? BPH with obstruction/lower urinary tract symptoms   ? Coombs positive   ? Followed by hematology  ? GERD (gastroesophageal reflux disease)   ? History of acute pyelonephritis 01/2017  ? Hyperlipidemia   ? Nonischemic cardiomyopathy ([place])   ? Right ureteral stone   ? Seasonal allergies   ? ? ?Past Surgical History:  ?Procedure Laterality Date  ? CATARACT EXTRACTION W/PHACO Right 08/15/2015  ? Procedure: CATARACT EXTRACTION PHACO AND INTRAOCULAR LENS PLACEMENT (IOC);  Surgeon: Tonny Branch, MD;  Location: [facility];  Service: Ophthalmology;  Laterality: Right;  CDE:5.75  ? CATARACT EXTRACTION W/PHACO Left 09/02/2015  ? Procedure: CATARACT EXTRACTION PHACO AND INTRAOCULAR LENS PLACEMENT LEFT EYE CDE=5.93;  Surgeon: Tonny Branch, MD;  Location: [facility];  Service: Ophthalmology;  Laterality: Left;  ? COLONOSCOPY N/A 08/28/2013  ? Procedure: COLONOSCOPY;  Surgeon: Daneil Dolin, MD;  Location: [facility];  Service: Endoscopy;  Laterality: N/A;  9:45  ? CYSTOSCOPY W/ URETERAL STENT PLACEMENT Right 01/26/2017  ? Procedure: CYSTOSCOPY WITH RETROGRADE PYELOGRAM/URETERAL STENT PLACEMENT;  Surgeon: Irine Seal, MD;  Location: [facility];  Service: Urology;  Laterality: Right;  ? CYSTOSCOPY WITH URETEROSCOPY AND STENT PLACEMENT Right 03/05/2017  ? Procedure: cystoscopy with right ureteroscopy and stent exchange ;  Surgeon: Festus Aloe, MD;  Location: [facility];  Service: Urology;  Laterality: Right;  ? NASAL SINUS SURGERY  2012  ? RIGHT/LEFT HEART CATH AND CORONARY ANGIOGRAPHY N/A 10/26/2017  ? Procedure: RIGHT/LEFT HEART  CATH AND CORONARY ANGIOGRAPHY;  Surgeon: Burnell Blanks, MD;  Location: [facility];  Service: Cardiovascular;  Laterality: N/A;  ? ROTATOR CUFF REPAIR Bilateral 1998  ? Vocal cord polyps  1990's  ? Benign  ? ? ?Prior to Admission medications   ?Medication Sig Start Date End Date Taking? Authorizing Provider  ?amLODipine (NORVASC) 5 MG tablet Take 7.5 mg by mouth daily. 07/19/17  Yes [provider]  ?Ascorbic Acid (VITAMIN C) 1000 MG tablet Take 1,000 mg by mouth daily.   Yes [provider]  ?atorvastatin (LIPITOR) 20 MG tablet Take 20 mg by mouth daily.   Yes [provider]  ?fluticasone (FLONASE) 50 MCG/ACT nasal spray Place 1 spray into the nose as needed for allergies.    Yes [provider]  ?ipratropium (ATROVENT) 0.03 % nasal spray Place 2 sprays into both nostrils 2 (two) times daily as needed for rhinitis. 12/01/16  Yes [provider]  ?meloxicam (MOBIC) 7.5 MG tablet Take 15 mg by mouth daily. 07/29/17  Yes [provider]  ?montelukast (SINGULAIR) 10 MG tablet Take 10 mg by mouth every evening. 12/14/16  Yes [provider]  ?Multiple Vitamins-Minerals (MENS 50+ MULTIVITAMIN) TABS Take 1 tablet by mouth daily.   Yes [provider]  ?pantoprazole (PROTONIX) 40 MG tablet Take 40 mg by mouth daily. 02/25/21  Yes [provider]  ?Quercetin 500 MG CAPS Take 500 mg by mouth daily.   Yes [provider]  ?tamsulosin (FLOMAX) 0.4 MG CAPS capsule Take 0.4 mg by mouth in the morning and  at bedtime. 08/15/13  Yes [provider]  ?zinc gluconate 50 MG tablet Take 50 mg by mouth daily.   Yes [provider]  ? ? ?Allergies as of 03/12/2022  ? (No Known Allergies)  ? ? ?Family History  ?Problem Relation Age of Onset  ? Hyperlipidemia Mother   ? Hypertension Mother   ? Hypertension Sister   ? Hypertension Brother   ? Colon cancer Neg Hx   ? ? ?Social History  ? ?Socioeconomic History  ? Marital  status: Widowed  ?  Spouse name: Not on file  ? Number of children: 4  ? Years of education: Not on file  ? Highest education level: Not on file  ?Occupational History  ? 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  Comment: [employer]  ? Occupation: Self employed  ?Tobacco Use  ? Smoking status: Former  ?  Packs/day: 1.50  ?  Years: 15.00  ?  Pack years: 22.50  ?  Types: Cigarettes  ?  Quit date: 08/12/1984  ?  Years since quitting: 37.6  ? Smokeless tobacco: Never  ?Vaping Use  ? Vaping Use: Never used  ?Substance and Sexual Activity  ? Alcohol use: Yes  ?  Comment: 1-2 weekly  ? Drug use: No  ? Sexual activity: Not on file  ?Other Topics Concern  ? Not on file  ?Social History Narrative  ? Not on file  ? ?Social Determinants of Health  ? ?Financial Resource Strain: Not on file  ?Food Insecurity: Not on file  ?Transportation Needs: Not on file  ?Physical Activity: Not on file  ?Stress: Not on file  ?Social Connections: Not on file  ?Intimate Partner Violence: Not on file  ? ? ?Review of Systems: ?See HPI, otherwise negative ROS ? ?Physical Exam: ?BP 135/71   Pulse 73   Temp 98.7 ?F (37.1 ?C) (Oral)   Resp 20   Ht 5' 8.5" (1.74 m)   Wt 88.5 kg   SpO2 98%   BMI 29.22 kg/m?  ?General:   Alert,  Well-developed, well-nourished, pleasant and cooperative in NAD ?Neck:  Supple; no masses or thyromegaly. No significant cervical adenopathy. ?Lungs:  Clear throughout to auscultation.   No wheezes, crackles, or rhonchi. No acute distress. ?Heart:  Regular rate and rhythm; no murmurs, clicks, rubs,  or gallops. ?Abdomen: Non-distended, normal bowel sounds.  Soft and nontender without appreciable mass or hepatosplenomegaly.  ?Pulses:  Normal pulses noted. ?Extremities:  Without clubbing or edema. ? ?Impression/Plan: 76 year old gent with a history of colonic adenomas; here for surveillance colonoscopy per plan. ?The risks, benefits, limitations, alternatives and imponderables have been reviewed with the patient.  Questions have been answered. All parties are agreeable.   ? ? ? ? ?Notice: This dictation was prepared with Dragon dictation along with smaller phrase technology. Any transcriptional errors that result from this process are unintentional and may not be corrected upon review.  ?

## 2022-03-30 NOTE — Discharge Instructions (Addendum)
?  Colonoscopy ?Discharge Instructions ? ?Read the instructions outlined below and refer to this sheet in the next few weeks. These discharge instructions provide you with general information on caring for yourself after you leave the hospital. Your doctor may also give you specific instructions. While your treatment has been planned according to the most current medical practices available, unavoidable complications occasionally occur. If you have any problems or questions after discharge, call Dr. Gala Romney at [phone]. ?ACTIVITY ?You may resume your regular activity, but move at a slower pace for the next 24 hours.  ?Take frequent rest periods for the next 24 hours.  ?Walking will help get rid of the air and reduce the bloated feeling in your belly (abdomen).  ?No driving for 24 hours (because of the medicine (anesthesia) used during the test).   ?Do not sign any important legal documents or operate any machinery for 24 hours (because of the anesthesia used during the test).  ?NUTRITION ?Drink plenty of fluids.  ?You may resume your normal diet as instructed by your doctor.  ?Begin with a light meal and progress to your normal diet. Heavy or fried foods are harder to digest and may make you feel sick to your stomach (nauseated).  ?Avoid alcoholic beverages for 24 hours or as instructed.  ?MEDICATIONS ?You may resume your normal medications unless your doctor tells you otherwise.  ?WHAT YOU CAN EXPECT TODAY ?Some feelings of bloating in the abdomen.  ?Passage of more gas than usual.  ?Spotting of blood in your stool or on the toilet paper.  ?IF YOU HAD POLYPS REMOVED DURING THE COLONOSCOPY: ?No aspirin products for 7 days or as instructed.  ?No alcohol for 7 days or as instructed.  ?Eat a soft diet for the next 24 hours.  ?FINDING OUT THE RESULTS OF YOUR TEST ?Not all test results are available during your visit. If your test results are not back during the visit, make an appointment with your caregiver to find out the  results. Do not assume everything is normal if you have not heard from your caregiver or the medical facility. It is important for you to follow up on all of your test results.  ?SEEK IMMEDIATE MEDICAL ATTENTION IF: ?You have more than a spotting of blood in your stool.  ?Your belly is swollen (abdominal distention).  ?You are nauseated or vomiting.  ?You have a temperature over 101.  ?You have abdominal pain or discomfort that is severe or gets worse throughout the day.   ? ?2 polyps removed today ? ?Polyp and diverticulosis information provided ? ?Further recommendations to follow pending review of pathology report ? ?At patient request, I called Bill Salinas at [phone] -reviewed findings and recommendations ? ? ? ? ?

## 2022-03-30 NOTE — Anesthesia Preprocedure Evaluation (Signed)
Anesthesia Evaluation  ?Patient identified by MRN, date of birth, ID band ?Patient awake ? ? ? ?Reviewed: ?Allergy & Precautions, H&P , NPO status , Patient's Chart, lab work & pertinent test results, reviewed documented beta blocker date and time  ? ?Airway ?Mallampati: II ? ?TM Distance: >3 FB ?Neck ROM: full ? ? ? Dental ?no notable dental hx. ? ?  ?Pulmonary ?shortness of breath, former smoker,  ?  ?Pulmonary exam normal ?breath sounds clear to auscultation ? ? ? ? ? ? Cardiovascular ?Exercise Tolerance: Good ?negative cardio ROS ? ? ?Rhythm:regular Rate:Normal ? ? ?  ?Neuro/Psych ?negative neurological ROS ? negative psych ROS  ? GI/Hepatic ?Neg liver ROS, GERD  Medicated,  ?Endo/Other  ?negative endocrine ROS ? Renal/GU ?  ?negative genitourinary ?  ?Musculoskeletal ? ? Abdominal ?  ?Peds ? Hematology ? ?(+) Blood dyscrasia, anemia ,   ?Anesthesia Other Findings ??1. Left ventricular ejection fraction, by estimation, is 55 to 60%. The  ?left ventricle has normal function. The left ventricle has no regional  ?wall motion abnormalities. Left ventricular diastolic parameters are  ?consistent with Grade I diastolic  ?dysfunction (impaired relaxation).  ??2. Right ventricular systolic function is normal. The right ventricular  ?size is normal. There is normal pulmonary artery systolic pressure. The  ?estimated right ventricular systolic pressure is 78.6 mmHg.  ??3. Left atrial size was mildly dilated.  ??4. Right atrial size was mildly dilated.  ??5. The mitral valve is grossly normal. Trivial mitral valve  ?regurgitation.  ??6. The aortic valve is tricuspid. Aortic valve regurgitation is trivial.  ?Mild aortic valve sclerosis is present, with no evidence of aortic valve  ?stenosis.  ??7. The inferior vena cava is normal in size with greater than 50%  ?respiratory variability, suggesting right atrial pressure of 3 mmHg.  ? Reproductive/Obstetrics ?negative OB ROS ? ?   ? ? ? ? ? ? ? ? ? ? ? ? ? ?  ?  ? ? ? ? ? ? ? ? ?Anesthesia Physical ?Anesthesia Plan ? ?ASA: 2 ? ?Anesthesia Plan: General  ? ?Post-op Pain Management:   ? ?Induction:  ? ?PONV Risk Score and Plan: Propofol infusion ? ?Airway Management Planned:  ? ?Additional Equipment:  ? ?Intra-op Plan:  ? ?Post-operative Plan:  ? ?Informed Consent: I have reviewed the patients History and Physical, chart, labs and discussed the procedure including the risks, benefits and alternatives for the proposed anesthesia with the patient or authorized representative who has indicated his/her understanding and acceptance.  ? ? ? ?Dental Advisory Given ? ?Plan Discussed with: CRNA ? ?Anesthesia Plan Comments:   ? ? ? ? ? ? ?Anesthesia Quick Evaluation ? ?

## 2022-03-31 NOTE — Addendum Note (Signed)
Addendum  created 03/31/22 1227 by Ollen Bowl, CRNA  ? Intraprocedure Staff edited  ?  ?

## 2022-04-01 ENCOUNTER — Encounter: Payer: Self-pay | Admitting: Internal Medicine

## 2022-04-01 LAB — SURGICAL PATHOLOGY

## 2022-04-03 NOTE — Op Note (Signed)
[facility] ?Patient Name: James Mccall ?Procedure Date: 03/30/2022 12:57 PM ?MRN: [id number] ?Date of Birth: 04-06-1946 ?Attending MD: Norvel Richards , MD ?CSN: [id number] ?Age: 76 ?Admit Type: Outpatient ?Procedure:                Colonoscopy ?Indications:              High risk colon cancer surveillance: Personal  ?                          history of colonic polyps ?Providers:                Norvel Richards, MD, Crystal Page, Eugene Garnet  ?                          Shanon Brow, Technician ?Referring MD:              ?Medicines:                Propofol per Anesthesia ?Complications:            No immediate complications. ?Estimated Blood Loss:     Estimated blood loss was minimal. Estimated blood  ?                          loss was minimal. ?Procedure:                Pre-Anesthesia Assessment: ?                          - Prior to the procedure, a History and Physical  ?                          was performed, and patient medications and  ?                          allergies were reviewed. The patient's tolerance of  ?                          previous anesthesia was also reviewed. The risks  ?                          and benefits of the procedure and the sedation  ?                          options and risks were discussed with the patient.  ?                          All questions were answered, and informed consent  ?                          was obtained. Prior Anticoagulants: The patient has  ?                          taken no previous anticoagulant or antiplatelet  ?                          agents. ASA Grade  Assessment: II - A patient with  ?                          mild systemic disease. After reviewing the risks  ?                          and benefits, the patient was deemed in  ?                          satisfactory condition to undergo the procedure. ?                          After obtaining informed consent, the colonoscope  ?                          was passed under direct vision.  Throughout the  ?                          procedure, the patient's blood pressure, pulse, and  ?                          oxygen saturations were monitored continuously. The  ?                          [phone]) scope was introduced through the  ?                          anus and advanced to the the cecum, identified by  ?                          appendiceal orifice and ileocecal valve. The  ?                          colonoscopy was performed without difficulty. The  ?                          patient tolerated the procedure well. The quality  ?                          of the bowel preparation was adequate. ?Scope In: 1:19:08 PM ?Scope Out: 1:36:48 PM ?Scope Withdrawal Time: 0 hours 10 minutes 14 seconds  ?Total Procedure Duration: 0 hours 17 minutes 40 seconds  ?Findings: ?     The perianal and digital rectal examinations were normal. ?     Scattered medium-mouthed diverticula were found in the sigmoid colon and  ?     descending colon. ?     Two semi-pedunculated polyps were found in the descending colon and  ?     hepatic flexure. The polyps were 4 to 6 mm in size. These polyps were  ?     removed with a cold snare. Resection and retrieval were complete.  ?     Estimated blood loss was minimal. ?     The exam was otherwise without abnormality on direct and retroflexion  ?     views. ?Impression:               -  Diverticulosis in the sigmoid colon and in the  ?                          descending colon. ?                          - Two 4 to 6 mm polyps in the descending colon and  ?                          at the hepatic flexure, removed with a cold snare.  ?                          Resected and retrieved. ?                          - The examination was otherwise normal on direct  ?                          and retroflexion views. ?Moderate Sedation: ?     Moderate (conscious) sedation was personally administered by an  ?     anesthesia professional. The following parameters were monitored: oxygen   ?     saturation, heart rate, blood pressure, respiratory rate, EKG, adequacy  ?     of pulmonary ventilation, and response to care. ?Recommendation:           - Patient has a contact number available for  ?                          emergencies. The signs and symptoms of potential  ?                          delayed complications were discussed with the  ?                          patient. Return to normal activities tomorrow.  ?                          Written discharge instructions were provided to the  ?                          patient. ?                          - Advance diet as tolerated. ?                          - Continue present medications. ?                          - Repeat colonoscopy date to be determined after  ?                          pending pathology results are reviewed for  ?                          surveillance  based on pathology results. ?                          - Return to GI office (date not yet determined). ?Procedure Code(s):        --- Professional --- ?                          [phone], Colonoscopy, flexible; with removal of  ?                          tumor(s), polyp(s), or other lesion(s) by snare  ?                          technique ?Diagnosis Code(s):        --- Professional --- ?                          Z86.010, Personal history of colonic polyps ?                          K63.5, Polyp of colon ?                          K57.30, Diverticulosis of large intestine without  ?                          perforation or abscess without bleeding ?CPT copyright 2019 [employer]. All rights reserved. ?The codes documented in this report are preliminary and upon coder review may  ?be revised to meet current compliance requirements. ?Cristopher Estimable. Henrick Mcgue, MD ?Norvel Richards, MD ?04/03/2022 2:03:31 PM ?This report has been signed electronically. ?Number of Addenda: 0 ?

## 2022-04-06 ENCOUNTER — Encounter (HOSPITAL_COMMUNITY): Payer: Self-pay | Admitting: Internal Medicine

## 2022-04-15 DIAGNOSIS — N5201 Erectile dysfunction due to arterial insufficiency: Secondary | ICD-10-CM | POA: Diagnosis not present

## 2022-04-15 DIAGNOSIS — R350 Frequency of micturition: Secondary | ICD-10-CM | POA: Diagnosis not present

## 2022-04-15 DIAGNOSIS — N401 Enlarged prostate with lower urinary tract symptoms: Secondary | ICD-10-CM | POA: Diagnosis not present

## 2022-08-04 DIAGNOSIS — I10 Essential (primary) hypertension: Secondary | ICD-10-CM | POA: Diagnosis not present

## 2022-08-04 DIAGNOSIS — Z01818 Encounter for other preprocedural examination: Secondary | ICD-10-CM | POA: Diagnosis not present

## 2022-08-04 DIAGNOSIS — E782 Mixed hyperlipidemia: Secondary | ICD-10-CM | POA: Diagnosis not present

## 2022-08-04 DIAGNOSIS — Z23 Encounter for immunization: Secondary | ICD-10-CM | POA: Diagnosis not present

## 2022-08-04 DIAGNOSIS — E7849 Other hyperlipidemia: Secondary | ICD-10-CM | POA: Diagnosis not present

## 2022-08-04 DIAGNOSIS — E663 Overweight: Secondary | ICD-10-CM | POA: Diagnosis not present

## 2022-08-04 DIAGNOSIS — Z6825 Body mass index (BMI) 25.0-25.9, adult: Secondary | ICD-10-CM | POA: Diagnosis not present

## 2022-08-13 DIAGNOSIS — N401 Enlarged prostate with lower urinary tract symptoms: Secondary | ICD-10-CM | POA: Diagnosis not present

## 2022-08-13 DIAGNOSIS — R350 Frequency of micturition: Secondary | ICD-10-CM | POA: Diagnosis not present

## 2022-08-21 ENCOUNTER — Other Ambulatory Visit: Payer: Self-pay | Admitting: Urology

## 2022-09-07 ENCOUNTER — Encounter (HOSPITAL_BASED_OUTPATIENT_CLINIC_OR_DEPARTMENT_OTHER): Payer: Self-pay | Admitting: Urology

## 2022-09-07 NOTE — Progress Notes (Signed)
Spoke w/ via phone for pre-op interview--- [place]---- EKG per anesthesia              Lab results------ COVID test -----patient states asymptomatic no test needed Arrive at -------6578 NPO after MN NO Solid Food.  Clear liquids from MN until---1045 Med rec completed Medications to take morning of surgery ----- Norvasc, Protonix and Flomax. Diabetic medication ----- Patient instructed no nail polish to be worn day of surgery Patient instructed to bring photo id and insurance card day of surgery Patient aware to have Driver (ride ) / caregiver  Friend Dorthea Leftwood  for 24 hours after surgery  Patient Special Instructions ----- Pre-Op special Istructions ----- Patient verbalized understanding of instructions that were given at this phone interview. Patient denies shortness of breath, chest pain, fever, cough at this phone interview.

## 2022-09-15 ENCOUNTER — Encounter (HOSPITAL_BASED_OUTPATIENT_CLINIC_OR_DEPARTMENT_OTHER)
Admission: RE | Disposition: A | Discharge status: Home / Self Care | Payer: Self-pay | Source: Home / Self Care | Attending: Urology

## 2022-09-15 ENCOUNTER — Ambulatory Visit (HOSPITAL_BASED_OUTPATIENT_CLINIC_OR_DEPARTMENT_OTHER): Payer: Medicare HMO | Admitting: Anesthesiology

## 2022-09-15 ENCOUNTER — Ambulatory Visit (HOSPITAL_BASED_OUTPATIENT_CLINIC_OR_DEPARTMENT_OTHER)
Admission: RE | Admit: 2022-09-15 | Discharge: 2022-09-15 | Disposition: A | Discharge status: Home / Self Care | Payer: Medicare HMO | Attending: Urology | Admitting: Urology

## 2022-09-15 ENCOUNTER — Encounter (HOSPITAL_BASED_OUTPATIENT_CLINIC_OR_DEPARTMENT_OTHER): Payer: Self-pay | Admitting: Urology

## 2022-09-15 DIAGNOSIS — Z79899 Other long term (current) drug therapy: Secondary | ICD-10-CM | POA: Insufficient documentation

## 2022-09-15 DIAGNOSIS — N401 Enlarged prostate with lower urinary tract symptoms: Secondary | ICD-10-CM | POA: Insufficient documentation

## 2022-09-15 DIAGNOSIS — R3912 Poor urinary stream: Secondary | ICD-10-CM | POA: Diagnosis not present

## 2022-09-15 DIAGNOSIS — Z87891 Personal history of nicotine dependence: Secondary | ICD-10-CM

## 2022-09-15 DIAGNOSIS — I10 Essential (primary) hypertension: Secondary | ICD-10-CM | POA: Diagnosis not present

## 2022-09-15 DIAGNOSIS — N3289 Other specified disorders of bladder: Secondary | ICD-10-CM | POA: Diagnosis not present

## 2022-09-15 DIAGNOSIS — K219 Gastro-esophageal reflux disease without esophagitis: Secondary | ICD-10-CM | POA: Diagnosis not present

## 2022-09-15 DIAGNOSIS — N138 Other obstructive and reflux uropathy: Secondary | ICD-10-CM

## 2022-09-15 HISTORY — DX: Essential (primary) hypertension: I10

## 2022-09-15 HISTORY — PX: THULIUM LASER TURP (TRANSURETHRAL RESECTION OF PROSTATE): SHX6744

## 2022-09-15 SURGERY — THULIUM LASER TURP (TRANSURETHRAL RESECTION OF PROSTATE)
Anesthesia: General | Site: Prostate

## 2022-09-15 MED ORDER — LIDOCAINE 2% (20 MG/ML) 5 ML SYRINGE
INTRAMUSCULAR | Status: DC | PRN
  Administered 2022-09-15: 50 mg via INTRAVENOUS

## 2022-09-15 MED ORDER — FENTANYL CITRATE (PF) 100 MCG/2ML IJ SOLN: 25.0000 ug | INTRAMUSCULAR | Status: DC | PRN

## 2022-09-15 MED ORDER — PHENYLEPHRINE HCL (PRESSORS) 10 MG/ML IV SOLN
INTRAVENOUS | Status: AC
  Filled 2022-09-15: qty 1

## 2022-09-15 MED ORDER — GLYCOPYRROLATE PF 0.2 MG/ML IJ SOSY
PREFILLED_SYRINGE | INTRAMUSCULAR | Status: AC
  Filled 2022-09-15: qty 1

## 2022-09-15 MED ORDER — FENTANYL CITRATE (PF) 100 MCG/2ML IJ SOLN
INTRAMUSCULAR | Status: AC
  Filled 2022-09-15: qty 2

## 2022-09-15 MED ORDER — OXYCODONE HCL 5 MG PO TABS: 5.0000 mg | ORAL_TABLET | Freq: Once | ORAL | Status: DC | PRN

## 2022-09-15 MED ORDER — EPHEDRINE 5 MG/ML INJ
INTRAVENOUS | Status: AC
  Filled 2022-09-15: qty 5

## 2022-09-15 MED ORDER — ACETAMINOPHEN 10 MG/ML IV SOLN: 1000.0000 mg | Freq: Once | INTRAVENOUS | Status: DC | PRN

## 2022-09-15 MED ORDER — SUCCINYLCHOLINE CHLORIDE 200 MG/10ML IV SOSY
PREFILLED_SYRINGE | INTRAVENOUS | Status: AC
  Filled 2022-09-15: qty 10

## 2022-09-15 MED ORDER — DEXAMETHASONE SODIUM PHOSPHATE 10 MG/ML IJ SOLN
INTRAMUSCULAR | Status: DC | PRN
  Administered 2022-09-15: 10 mg via INTRAVENOUS

## 2022-09-15 MED ORDER — CEFAZOLIN SODIUM-DEXTROSE 2-4 GM/100ML-% IV SOLN
INTRAVENOUS | Status: AC
  Filled 2022-09-15: qty 100

## 2022-09-15 MED ORDER — PHENYLEPHRINE 80 MCG/ML (10ML) SYRINGE FOR IV PUSH (FOR BLOOD PRESSURE SUPPORT)
PREFILLED_SYRINGE | INTRAVENOUS | Status: AC
  Filled 2022-09-15: qty 10

## 2022-09-15 MED ORDER — OXYCODONE HCL 5 MG/5ML PO SOLN: 5.0000 mg | Freq: Once | ORAL | Status: DC | PRN

## 2022-09-15 MED ORDER — ONDANSETRON HCL 4 MG/2ML IJ SOLN: 4.0000 mg | Freq: Once | INTRAMUSCULAR | Status: DC | PRN

## 2022-09-15 MED ORDER — SUCCINYLCHOLINE CHLORIDE 200 MG/10ML IV SOSY
PREFILLED_SYRINGE | INTRAVENOUS | Status: DC | PRN
  Administered 2022-09-15: 60 mg via INTRAVENOUS

## 2022-09-15 MED ORDER — PROPOFOL 10 MG/ML IV BOLUS
INTRAVENOUS | Status: AC
  Filled 2022-09-15: qty 20

## 2022-09-15 MED ORDER — LACTATED RINGERS IV SOLN: INTRAVENOUS | Status: DC

## 2022-09-15 MED ORDER — SODIUM CHLORIDE 0.9 % IR SOLN
Status: DC | PRN
  Administered 2022-09-15 (×3): 6000 mL

## 2022-09-15 MED ORDER — CEPHALEXIN 500 MG PO CAPS: 500.0000 mg | ORAL_CAPSULE | Freq: Every day | ORAL | 0 refills | Status: AC

## 2022-09-15 MED ORDER — ONDANSETRON HCL 4 MG/2ML IJ SOLN
INTRAMUSCULAR | Status: AC
  Filled 2022-09-15: qty 2

## 2022-09-15 MED ORDER — DEXAMETHASONE SODIUM PHOSPHATE 10 MG/ML IJ SOLN
INTRAMUSCULAR | Status: AC
  Filled 2022-09-15: qty 1

## 2022-09-15 MED ORDER — FENTANYL CITRATE (PF) 100 MCG/2ML IJ SOLN
INTRAMUSCULAR | Status: DC | PRN
  Administered 2022-09-15 (×2): 50 ug via INTRAVENOUS

## 2022-09-15 MED ORDER — GLYCOPYRROLATE PF 0.2 MG/ML IJ SOSY
PREFILLED_SYRINGE | INTRAMUSCULAR | Status: DC | PRN
  Administered 2022-09-15: .2 mg via INTRAVENOUS

## 2022-09-15 MED ORDER — PHENYLEPHRINE 80 MCG/ML (10ML) SYRINGE FOR IV PUSH (FOR BLOOD PRESSURE SUPPORT)
PREFILLED_SYRINGE | INTRAVENOUS | Status: DC | PRN
  Administered 2022-09-15: 80 ug via INTRAVENOUS
  Administered 2022-09-15 (×2): 160 ug via INTRAVENOUS
  Administered 2022-09-15: 80 ug via INTRAVENOUS
  Administered 2022-09-15 (×2): 160 ug via INTRAVENOUS

## 2022-09-15 MED ORDER — PROPOFOL 10 MG/ML IV BOLUS
INTRAVENOUS | Status: DC | PRN
  Administered 2022-09-15: 100 mg via INTRAVENOUS
  Administered 2022-09-15: 200 mg via INTRAVENOUS
  Administered 2022-09-15: 100 mg via INTRAVENOUS

## 2022-09-15 MED ORDER — PHENYLEPHRINE HCL-NACL 20-0.9 MG/250ML-% IV SOLN
INTRAVENOUS | Status: DC | PRN
  Administered 2022-09-15: 40 ug/min via INTRAVENOUS

## 2022-09-15 MED ORDER — EPHEDRINE SULFATE-NACL 50-0.9 MG/10ML-% IV SOSY
PREFILLED_SYRINGE | INTRAVENOUS | Status: DC | PRN
  Administered 2022-09-15: 15 mg via INTRAVENOUS
  Administered 2022-09-15 (×2): 10 mg via INTRAVENOUS
  Administered 2022-09-15: 15 mg via INTRAVENOUS

## 2022-09-15 MED ORDER — CEFAZOLIN SODIUM-DEXTROSE 2-4 GM/100ML-% IV SOLN
2.0000 g | INTRAVENOUS | Status: AC
  Administered 2022-09-15: 2 g via INTRAVENOUS

## 2022-09-15 SURGICAL SUPPLY — 32 items
BAG DRAIN URO-CYSTO SKYTR STRL (DRAIN) ×1 IMPLANT
BAG DRN RND TRDRP ANRFLXCHMBR (UROLOGICAL SUPPLIES) ×1
BAG DRN UROCATH (DRAIN) ×1
BAG URINE DRAIN 2000ML AR STRL (UROLOGICAL SUPPLIES) ×1 IMPLANT
CATH COUDE FOLEY 2W 5CC 18FR (CATHETERS) IMPLANT
CATH FOLEY 2WAY SLVR  5CC 18FR (CATHETERS)
CATH FOLEY 2WAY SLVR  5CC 20FR (CATHETERS) ×1
CATH FOLEY 2WAY SLVR 5CC 18FR (CATHETERS) IMPLANT
CATH FOLEY 2WAY SLVR 5CC 20FR (CATHETERS) IMPLANT
CATH FOLEY 3WAY 30CC 22FR (CATHETERS) IMPLANT
CLOTH BEACON ORANGE TIMEOUT ST (SAFETY) ×1 IMPLANT
ELECT BIVAP BIPO 22/24 DONUT (ELECTROSURGICAL)
ELECTRD BIVAP BIPO 22/24 DONUT (ELECTROSURGICAL) IMPLANT
GLOVE BIO SURGEON STRL SZ7.5 (GLOVE) ×1 IMPLANT
GLOVE BIO SURGEON STRL SZ8 (GLOVE) IMPLANT
GOWN STRL REUS W/TWL LRG LVL3 (GOWN DISPOSABLE) ×1 IMPLANT
HOLDER FOLEY CATH W/STRAP (MISCELLANEOUS) IMPLANT
IV NS 1000ML (IV SOLUTION) ×1
IV NS 1000ML BAXH (IV SOLUTION) ×1 IMPLANT
IV NS IRRIG 3000ML ARTHROMATIC (IV SOLUTION) ×1 IMPLANT
IV SET EXTENSION GRAVITY 40 LF (IV SETS) ×1 IMPLANT
KIT TURNOVER CYSTO (KITS) ×1 IMPLANT
LASER REVOLIX HI ENERGY 1000 (Laser) ×1 IMPLANT
LASER REVOLIX PROCEDURE (MISCELLANEOUS) ×1 IMPLANT
LOOP CUT BIPOLAR 24F LRG (ELECTROSURGICAL) IMPLANT
MANIFOLD NEPTUNE II (INSTRUMENTS) IMPLANT
PACK CYSTO (CUSTOM PROCEDURE TRAY) ×1 IMPLANT
SYR 30ML LL (SYRINGE) IMPLANT
SYR TOOMEY IRRIG 70ML (MISCELLANEOUS) ×1
SYRINGE TOOMEY IRRIG 70ML (MISCELLANEOUS) IMPLANT
TUBE CONNECTING 12X1/4 (SUCTIONS) IMPLANT
TUBING UROLOGY SET (TUBING) IMPLANT

## 2022-09-15 NOTE — Op Note (Signed)
Preoperative diagnosis: BPH with lower urinary tract symptoms, weak stream Postoperative diagnosis: Same  Procedure: Thulium laser vaporization of prostate  Surgeon: Junious Silk  Anesthesia: General  Indication for procedure: Seger is a 76 year old male who had progressive lower urinary tract symptoms on tamsulosin.  Prostate was 51 g with trilobar hypertrophy and he elected to proceed with laser vaporization.  Findings: On cystoscopy the urethra was unremarkable, prostatic urethra obstructed with lateral lobe hypertrophy, bladder without stone or foreign body.  Ureteral orifice ease were in their normal orthotopic position with clear E flux.  No mucosal lesions.  Moderate trabeculation.  Description of procedure: After consent was obtained patient brought to the operating room.  After adequate anesthesia he was placed in lithotomy position and prepped and draped in the usual sterile fashion.  Timeout was performed to confirm the patient and procedure.  The cystoscope was passed per urethra and the bladder carefully inspected.  We were utilizing the continuous-flow laser sheath.  A 600 m laser fiber, side firing, was passed.  I made incisions at 5 and 7:00 to outline the mid and lobe and brought those down to the verumontanum.  I had marked the ureteral orifice ease at low power.  I then vaporized the median lobe.  I then started anterior to posterior vaporized the lateral lobes.  Closer to the verumontanum I made the usual incision lateral to the verumontanum's into the lateral lobes and connected those incisions proximally thus vaporizing the lateral lobes bilaterally.  The bladder was emptied and some residual lateral lobe tissue was vaporized.  This created an excellent channel.  Hemostasis was excellent low-pressure.  Ureteral orifice ease were checked and noted to be normal as well as no vaporization distal to the verumontanum.  The scope was removed and 7 French Foley catheter was placed and left  to gravity drainage.  Urine was clear.  He was awakened taken recovery room in stable condition.  Complications: None  Blood loss: Minimal  Specimens: None  Drains: 18 French Foley catheter  Disposition: Patient stable to PACU.

## 2022-09-15 NOTE — Anesthesia Preprocedure Evaluation (Signed)
Anesthesia Evaluation  Patient identified by MRN, date of birth, ID band Patient awake    Reviewed: Allergy & Precautions, NPO status , Patient's Chart, lab work & pertinent test results  Airway Mallampati: III  TM Distance: <3 FB Neck ROM: Full    Dental no notable dental hx.    Pulmonary neg pulmonary ROS, former smoker,    Pulmonary exam normal breath sounds clear to auscultation       Cardiovascular hypertension, Pt. on medications Normal cardiovascular exam Rhythm:Regular Rate:Normal     Neuro/Psych negative neurological ROS  negative psych ROS   GI/Hepatic Neg liver ROS, GERD  Medicated,  Endo/Other  negative endocrine ROS  Renal/GU negative Renal ROS  negative genitourinary   Musculoskeletal negative musculoskeletal ROS (+)   Abdominal   Peds negative pediatric ROS (+)  Hematology negative hematology ROS (+)   Anesthesia Other Findings   Reproductive/Obstetrics negative OB ROS                             Anesthesia Physical Anesthesia Plan  ASA: 3  Anesthesia Plan: General   Post-op Pain Management: Minimal or no pain anticipated   Induction: Intravenous  PONV Risk Score and Plan: 2 and Ondansetron, Dexamethasone and Treatment may vary due to age or medical condition  Airway Management Planned: LMA  Additional Equipment:   Intra-op Plan:   Post-operative Plan: Extubation in OR  Informed Consent: I have reviewed the patients History and Physical, chart, labs and discussed the procedure including the risks, benefits and alternatives for the proposed anesthesia with the patient or authorized representative who has indicated his/her understanding and acceptance.     Dental advisory given  Plan Discussed with: CRNA and Surgeon  Anesthesia Plan Comments:         Anesthesia Quick Evaluation

## 2022-09-15 NOTE — H&P (Signed)
H&P  Chief Complaint: BPH, lower urinary tract symptoms  History of Present Illness: James Mccall is a 76 year old male with a history of progressive lower urinary tract symptoms.  He is tried medication with some relief specifically tamsulosin.  He would like to get off medication.  He had a 51 g prostate on ultrasound with lateral and median lobe hypertrophy on cystoscopy.  He presents today for thulium laser vaporization of the prostate.  He has been well without cough, cold or congestion.  No dysuria or fever.  No gross hematuria.   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   GERD (gastroesophageal reflux disease)    History of acute pyelonephritis 01/2017   Hyperlipidemia    Hypertension    Nonischemic cardiomyopathy ([place])    Right ureteral stone    Seasonal allergies    Past Surgical History:  Procedure Laterality Date   CATARACT EXTRACTION W/PHACO Right 08/15/2015   Procedure: CATARACT EXTRACTION PHACO AND INTRAOCULAR LENS PLACEMENT ([place]);  Surgeon: Tonny Branch, MD;  Location: [facility];  Service: Ophthalmology;  Laterality: Right;  CDE:5.75   CATARACT EXTRACTION W/PHACO Left 09/02/2015   Procedure: CATARACT EXTRACTION PHACO AND INTRAOCULAR LENS PLACEMENT LEFT EYE CDE=5.93;  Surgeon: Tonny Branch, MD;  Location: [facility];  Service: Ophthalmology;  Laterality: Left;   COLONOSCOPY N/A 08/28/2013   Procedure: COLONOSCOPY;  Surgeon: Daneil Dolin, MD;  Location: [facility];  Service: Endoscopy;  Laterality: N/A;  9:45   COLONOSCOPY WITH PROPOFOL N/A 03/30/2022   Procedure: COLONOSCOPY WITH PROPOFOL;  Surgeon: Daneil Dolin, MD;  Location: [facility];  Service: Endoscopy;  Laterality: N/A;  2:00 / ASA 2   CYSTOSCOPY W/ URETERAL STENT PLACEMENT Right 01/26/2017   Procedure: CYSTOSCOPY WITH RETROGRADE PYELOGRAM/URETERAL STENT PLACEMENT;   Surgeon: Irine Seal, MD;  Location: [facility];  Service: Urology;  Laterality: Right;   CYSTOSCOPY WITH URETEROSCOPY AND STENT PLACEMENT Right 03/05/2017   Procedure: cystoscopy with right ureteroscopy and stent exchange ;  Surgeon: Festus Aloe, MD;  Location[facility];  Service: Urology;  Laterality: Right;   NASAL SINUS SURGERY  2012   POLYPECTOMY  03/30/2022   Procedure: POLYPECTOMY INTESTINAL;  Surgeon: Daneil Dolin, MD;  [facility]: [facility];  Service: Endoscopy;;   RIGHT/LEFT HEART CATH AND CORONARY ANGIOGRAPHY N/A 10/26/2017   Procedure: RIGHT/LEFT HEART CATH AND CORONARY ANGIOGRAPHY;  Surgeon: Burnell Blanks, MD;  Location: [facility];  Service: Cardiovascular;  Laterality: N/A;   ROTATOR CUFF REPAIR Bilateral 1998   Vocal cord polyps  1990's   Benign    Home Medications:  Medications Prior to Admission  Medication Sig Dispense Refill Last Dose   amLODipine (NORVASC) 5 MG tablet Take 7.5 mg by mouth daily.   09/15/2022 at 0900   Ascorbic Acid (VITAMIN C) 1000 MG tablet Take 1,000 mg by mouth daily.   09/14/2022   atorvastatin (LIPITOR) 20 MG tablet Take 20 mg by mouth daily.   09/14/2022   fluticasone (FLONASE) 50 MCG/ACT nasal spray Place 1 spray into the nose as needed for allergies.    Past Week   meloxicam (MOBIC) 7.5 MG tablet Take 15 mg by mouth daily.   09/15/2022 at 0900   montelukast (SINGULAIR) 10 MG tablet Take 10 mg by mouth every evening.   09/14/2022  Multiple Vitamins-Minerals (MENS 50+ MULTIVITAMIN) TABS Take 1 tablet by mouth daily.   09/14/2022   pantoprazole (PROTONIX) 40 MG tablet Take 40 mg by mouth daily.   09/15/2022 at 0900   Quercetin 500 MG CAPS Take 500 mg by mouth daily.   09/14/2022   tamsulosin (FLOMAX) 0.4 MG CAPS capsule Take 0.4 mg by mouth in the morning and at bedtime.   09/15/2022 at 0900   zinc gluconate 50 MG tablet Take 50 mg by mouth daily.   09/14/2022   ipratropium (ATROVENT) 0.03 % nasal spray Place  2 sprays into both nostrils 2 (two) times daily as needed for rhinitis.   More than a month   Allergies: No Known Allergies  Family History  Problem Relation Age of Onset   Hyperlipidemia Mother    Hypertension Mother    Hypertension Sister    Hypertension Brother    Colon cancer Neg Hx    Social History:  reports that he quit smoking about 38 years ago. His smoking use included cigarettes. He has a 22.50 pack-year smoking history. He has never used smokeless tobacco. He reports current alcohol use. He reports that he does not use drugs.  ROS: A complete review of systems was performed.  All systems are negative except for pertinent findings as noted. Review of Systems  All other systems reviewed and are negative.    Physical Exam:  Vital signs in last 24 hours: Temp:  [97.8 F (36.6 C)] 97.8 F (36.6 C) (10/24 1222) Pulse Rate:  [66] 66 (10/24 1222) Resp:  [16] 16 (10/24 1222) BP: (144)/(74) 144/74 (10/24 1222) SpO2:  [98 %] 98 % (10/24 1222) Weight:  [92.4 kg] 92.4 kg (10/24 1222) General:  Alert and oriented, No acute distress HEENT: Normocephalic, atraumatic Cardiovascular: Regular rate and rhythm Lungs: Regular rate and effort Abdomen: Soft, nontender, nondistended, no abdominal masses Back: No CVA tenderness Extremities: No edema Neurologic: Grossly intact  Laboratory Data:  No results found for this or any previous visit (from the past 24 hour(s)). No results found for this or any previous visit (from the past 240 hour(s)). Creatinine: No results for input(s): "CREATININE" in the last 168 hours.  Impression/Assessment:  BPH with LUTS, weak stream -   Plan:  I discussed with the patient the nature, potential benefits, risks and alternatives to thulium laser vaporization prostate, including side effects of the proposed treatment, the likelihood of the patient achieving the goals of the procedure, and any potential problems that might occur during the procedure  or recuperation.  We discussed expectations for voiding and storage symptoms.  All questions answered. Patient elects to proceed.    Festus Aloe 09/15/2022, 1:28 PM

## 2022-09-15 NOTE — Anesthesia Postprocedure Evaluation (Signed)
Anesthesia Post Note  Patient: James Mccall  Procedure(s) Performed: Marcelino Duster LASER TURP (TRANSURETHRAL RESECTION OF PROSTATE) (Prostate)     Patient location during evaluation: PACU Anesthesia Type: General Level of consciousness: awake and alert Pain management: pain level controlled Vital Signs Assessment: post-procedure vital signs reviewed and stable Respiratory status: spontaneous breathing, nonlabored ventilation, respiratory function stable and patient connected to nasal cannula oxygen Cardiovascular status: blood pressure returned to baseline and stable Postop Assessment: no apparent nausea or vomiting Anesthetic complications: no   No notable events documented.  Last Vitals:  Vitals:   09/15/22 1530 09/15/22 1545  BP: (!) 188/62 114/61  Pulse: 89 81  Resp: 16 15  Temp:    SpO2: 100% 96%    Last Pain:  Vitals:   09/15/22 1545  TempSrc:   PainSc: 0-No pain                 Jaedynn Bohlken S

## 2022-09-15 NOTE — Anesthesia Procedure Notes (Signed)
Procedure Name: LMA Insertion Date/Time: 09/15/2022 1:52 PM  Performed by: Mechele Claude, CRNAPre-anesthesia Checklist: Patient identified, Emergency Drugs available, Suction available and Patient being monitored Patient Re-evaluated:Patient Re-evaluated prior to induction Oxygen Delivery Method: Circle system utilized Preoxygenation: Pre-oxygenation with 100% oxygen Induction Type: IV induction Ventilation: Mask ventilation without difficulty LMA: LMA inserted LMA Size: 4.0 Number of attempts: 1 Airway Equipment and Method: Bite block Placement Confirmation: positive ETCO2 Tube secured with: Tape Dental Injury: Teeth and Oropharynx as per pre-operative assessment  Comments: LMA placed with ease. Unable to ventilate. Repositioned LMA with good result.

## 2022-09-15 NOTE — Transfer of Care (Signed)
Immediate Anesthesia Transfer of Care Note  Patient: James Mccall  Procedure(s) Performed: Procedure(s) (LRB): THULIUM LASER TURP (TRANSURETHRAL RESECTION OF PROSTATE) (N/A)  Patient Location: PACU  Anesthesia Type: General  Level of Consciousness: awake, alert  and oriented  Airway & Oxygen Therapy: Patient Spontanous Breathing and Patient connected to face mask oxygen  Post-op Assessment: Report given to PACU RN and Post -op Vital signs reviewed and stable  Post vital signs: Reviewed and stable  Complications: No apparent anesthesia complications  Last Vitals:  Vitals Value Taken Time  BP 120/62 09/15/22 1522  Temp    Pulse 90 09/15/22 1522  Resp 16 09/15/22 1522  SpO2 99 % 09/15/22 1522  Vitals shown include unvalidated device data.  Last Pain:  Vitals:   09/15/22 1222  TempSrc: Oral  PainSc: 0-No pain      Patients Stated Pain Goal: 2 (64/38/37 7939)  Complications: No notable events documented.

## 2022-09-15 NOTE — Discharge Instructions (Signed)

## 2022-09-16 ENCOUNTER — Encounter (HOSPITAL_BASED_OUTPATIENT_CLINIC_OR_DEPARTMENT_OTHER): Payer: Self-pay | Admitting: Urology

## 2022-09-18 ENCOUNTER — Other Ambulatory Visit: Payer: Self-pay

## 2022-09-18 ENCOUNTER — Emergency Department (HOSPITAL_COMMUNITY): Payer: Medicare HMO

## 2022-09-18 ENCOUNTER — Emergency Department (HOSPITAL_COMMUNITY)
Admission: EM | Admit: 2022-09-18 | Discharge: 2022-09-18 | Disposition: A | Discharge status: Home / Self Care | Payer: Medicare HMO | Attending: Emergency Medicine | Admitting: Emergency Medicine

## 2022-09-18 ENCOUNTER — Encounter (HOSPITAL_COMMUNITY): Payer: Self-pay

## 2022-09-18 DIAGNOSIS — R0789 Other chest pain: Secondary | ICD-10-CM | POA: Diagnosis not present

## 2022-09-18 DIAGNOSIS — R0602 Shortness of breath: Secondary | ICD-10-CM | POA: Diagnosis not present

## 2022-09-18 DIAGNOSIS — R42 Dizziness and giddiness: Secondary | ICD-10-CM | POA: Insufficient documentation

## 2022-09-18 DIAGNOSIS — I10 Essential (primary) hypertension: Secondary | ICD-10-CM | POA: Insufficient documentation

## 2022-09-18 DIAGNOSIS — Z79899 Other long term (current) drug therapy: Secondary | ICD-10-CM | POA: Diagnosis not present

## 2022-09-18 DIAGNOSIS — R079 Chest pain, unspecified: Secondary | ICD-10-CM

## 2022-09-18 LAB — CBC
HCT: 35.7 % — ABNORMAL LOW (ref 39.0–52.0)
Hemoglobin: 13 g/dL (ref 13.0–17.0)
MCH: 28.4 pg (ref 26.0–34.0)
MCHC: 36.4 g/dL — ABNORMAL HIGH (ref 30.0–36.0)
MCV: 77.9 fL — ABNORMAL LOW (ref 80.0–100.0)
Platelets: 196 10*3/uL (ref 150–400)
RBC: 4.58 MIL/uL (ref 4.22–5.81)
RDW: 18.6 % — ABNORMAL HIGH (ref 11.5–15.5)
WBC: 6.7 10*3/uL (ref 4.0–10.5)
nRBC: 0 % (ref 0.0–0.2)

## 2022-09-18 LAB — BASIC METABOLIC PANEL
Anion gap: 7 (ref 5–15)
BUN: 21 mg/dL (ref 8–23)
CO2: 29 mmol/L (ref 22–32)
Calcium: 9.2 mg/dL (ref 8.9–10.3)
Chloride: 104 mmol/L (ref 98–111)
Creatinine, Ser: 0.99 mg/dL (ref 0.61–1.24)
GFR, Estimated: >60 mL/min (ref >60)
Glucose, Bld: 95 mg/dL (ref 70–99)
Potassium: 3.9 mmol/L (ref 3.5–5.1)
Sodium: 140 mmol/L (ref 135–145)

## 2022-09-18 LAB — D-DIMER, QUANTITATIVE: D-Dimer, Quant: 0.46 ug/mL-FEU (ref 0.00–0.50)

## 2022-09-18 LAB — TROPONIN I (HIGH SENSITIVITY)
Troponin I (High Sensitivity): 4 ng/L (ref <18)
Troponin I (High Sensitivity): 6 ng/L (ref <18)

## 2022-09-18 NOTE — ED Provider Notes (Signed)
[facility] EMERGENCY DEPARTMENT Provider Note   CSN: [id number] Arrival date & time: 09/18/22  1432     History  Chief Complaint  Patient presents with   Chest Pain    James Mccall is a 76 y.o. male.   Chest Pain Patient presents with chest pain.  Anterior chest.  Around 2:00 developed some tightness in his chest.  May have felt a little shortness of breath and felt a little dizzy with it.  Blood pressure was elevated.  History of nonischemic cardiomyopathy.  Did have a TURP around 3 days ago that sounds uncomplicated.  States he did have some pain in his lower ribs bilaterally since the surgery.  No swelling his legs.  No cough.  No hemoptysis.    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 Authorizing Provider  amLODipine (NORVASC) 5 MG tablet Take 7.5 mg by mouth daily. 07/19/17  Yes [provider]  Ascorbic Acid (VITAMIN C) 1000 MG tablet Take 1,000 mg by mouth daily.   Yes [provider]  atorvastatin (LIPITOR) 20 MG tablet Take 20 mg by mouth daily.   Yes [provider]  cephALEXin (KEFLEX) 500 MG capsule Take 1 capsule (500 mg total) by mouth at bedtime. 09/15/22  Yes Festus Aloe, MD  chlorhexidine (PERIDEX) 0.12 % solution SMARTSIG:By Mouth 08/07/22  Yes [provider]  fluticasone (FLONASE) 50 MCG/ACT nasal spray Place 1 spray into the nose as needed for allergies.    Yes [provider]  ipratropium (ATROVENT) 0.03 % nasal spray Place 2 sprays into both  nostrils 2 (two) times daily as needed for rhinitis. 12/01/16  Yes [provider]  meloxicam (MOBIC) 7.5 MG tablet Take 15 mg by mouth daily. 07/29/17  Yes [provider]  montelukast (SINGULAIR) 10 MG tablet Take 10 mg by mouth daily. 12/14/16  Yes [provider]  Multiple Vitamins-Minerals (MENS 50+ MULTIVITAMIN) TABS Take 1 tablet by mouth daily.   Yes [provider]  oxyCODONE (OXY IR/ROXICODONE) 5 MG immediate release tablet Take 5 mg by mouth every 6 (six) hours as needed. 08/07/22  Yes [provider]  pantoprazole (PROTONIX) 40 MG tablet Take 40 mg by mouth daily. 02/25/21  Yes [provider]  Quercetin 500 MG CAPS Take 500 mg by mouth daily.   Yes [provider]  tamsulosin (FLOMAX) 0.4 MG CAPS capsule Take 0.4 mg by mouth in the morning and at bedtime. 08/15/13  Yes [provider]  zinc gluconate 50 MG tablet Take 50 mg by mouth daily.   Yes [provider]  amoxicillin (AMOXIL) 500 MG capsule Take 500 mg by mouth every 8 (eight) hours. Patient not taking: Reported on 09/18/2022 08/07/22   [provider]  tadalafil (CIALIS) 5 MG tablet Take 5 mg by mouth daily. Patient not taking: Reported on 09/18/2022 04/15/22   [provider]      Allergies    Patient has no known allergies.  Review of Systems   Review of Systems  Cardiovascular:  Positive for chest pain.    Physical Exam Updated Vital Signs BP 131/72   Pulse 62   Temp 98.9 F (37.2 C) (Oral)   Resp 19   Ht '5\' 9"'$  (1.753 m)   Wt 92.1 kg   SpO2 99%   BMI 29.98 kg/m  Physical Exam Vitals and nursing note reviewed.  HENT:     Head: Atraumatic.  Cardiovascular:     Rate and Rhythm: Normal rate and regular rhythm.  Pulmonary:     Breath sounds: No wheezing or rhonchi.  Chest:     Chest wall: No tenderness.  Abdominal:     Tenderness: There is no abdominal tenderness.  Musculoskeletal:     Right lower leg: No edema.      Left lower leg: No edema.  Neurological:     Mental Status: He is alert.     ED Results / Procedures / Treatments   Labs (all labs ordered are listed, but only abnormal results are displayed) Labs Reviewed  CBC - Abnormal; Notable for the following components:      Result Value   HCT 35.7 (*)    MCV 77.9 (*)    MCHC 36.4 (*)    RDW 18.6 (*)    All other components within normal limits  BASIC METABOLIC PANEL  D-DIMER, QUANTITATIVE  TROPONIN I (HIGH SENSITIVITY)  TROPONIN I (HIGH SENSITIVITY)    EKG None  Radiology DG Chest 2 View  Result Date: 09/18/2022 CLINICAL DATA:  Tightening sensation in the chest with dizziness. Hypertension. EXAM: CHEST - 2 VIEW COMPARISON:  10/19/2017 FINDINGS: The heart size and mediastinal contours are within normal limits. Both lungs are clear. The visualized skeletal structures are unremarkable. IMPRESSION: No active cardiopulmonary disease. Electronically Signed   By: Van Clines M.D.   On: 09/18/2022 15:13    Procedures Procedures    Medications Ordered in ED Medications - No data to display  ED Course/ Medical Decision Making/ A&P                           Medical Decision Making Amount and/or Complexity of Data Reviewed Labs: ordered. Radiology: ordered.   Patient anterior chest pain.  Recent surgery.  Differential diagnosis includes nonspecific chest pain, coronary artery disease, ACS, pneumothorax, pulm embolism.  Chest x-ray independently interpreted and reassuring.  EKG reassuring.  Initial troponin negative.  D-dimer negative.  I believe she is low enough that we can rule out pulmonary embolism.  Will get delta troponin but likely be able to discharge home.  Blood pressure was reportedly elevated at home but is improved here.  Reviewed previous cardiology notes.  Has had previous heart catheterization with no large vessel disease.  D-dimer is negative.  Repeat troponin also negative.  Chest x-ray and apparently  troponin reassuring.  Appears stable for discharge home.  Outpatient follow-up with with PCP as needed.        Final Clinical Impression(s) / ED Diagnoses Final diagnoses:  Nonspecific chest pain    Rx / DC Orders ED Discharge Orders     None         Davonna Belling, MD 09/18/22 [phone]

## 2022-09-18 NOTE — ED Triage Notes (Addendum)
Pt reports around 2pm he had  a feeling in his chest that he hadn't felt before.  Describes as an "unusual tightening."  Says he started feeling dizzy.  Reports he checked his bp and it  was 171/90 and said his bp is usually not that high.  Pt says no longer feels dizzy and denies tightness in chest but says "something feels abnormal in my chest."   Reports episode last approx 10 min. Reports was resting when this started. Pt also says he had a TURP procedure on 10/24 this week.

## 2022-09-28 DIAGNOSIS — R3915 Urgency of urination: Secondary | ICD-10-CM | POA: Diagnosis not present

## 2022-09-28 DIAGNOSIS — R351 Nocturia: Secondary | ICD-10-CM | POA: Diagnosis not present

## 2022-09-28 DIAGNOSIS — R350 Frequency of micturition: Secondary | ICD-10-CM | POA: Diagnosis not present

## 2022-09-28 DIAGNOSIS — N401 Enlarged prostate with lower urinary tract symptoms: Secondary | ICD-10-CM | POA: Diagnosis not present

## 2022-10-26 DIAGNOSIS — R3915 Urgency of urination: Secondary | ICD-10-CM | POA: Diagnosis not present

## 2022-12-02 ENCOUNTER — Ambulatory Visit: Payer: Medicare HMO | Attending: Cardiology | Admitting: Cardiology

## 2022-12-02 ENCOUNTER — Encounter: Payer: Self-pay | Admitting: Cardiology

## 2022-12-02 VITALS — BP 140/80 | HR 96 | Ht 69.0 in | Wt 203.4 lb

## 2022-12-02 DIAGNOSIS — I10 Essential (primary) hypertension: Secondary | ICD-10-CM | POA: Diagnosis not present

## 2022-12-02 DIAGNOSIS — Z8679 Personal history of other diseases of the circulatory system: Secondary | ICD-10-CM

## 2022-12-02 DIAGNOSIS — R0602 Shortness of breath: Secondary | ICD-10-CM | POA: Diagnosis not present

## 2022-12-02 NOTE — Patient Instructions (Signed)
Medication Instructions:  Your physician recommends that you continue on your current medications as directed. Please refer to the Current Medication list given to you today.   Labwork: None  Testing/Procedures: Your physician has requested that you have an echocardiogram. Echocardiography is a painless test that uses sound waves to create images of your heart. It provides your doctor with information about the size and shape of your heart and how well your heart's chambers and valves are working. This procedure takes approximately one hour. There are no restrictions for this procedure. Please do NOT wear cologne, perfume, aftershave, or lotions (deodorant is allowed). Please arrive 15 minutes prior to your appointment time.   Follow-Up: Follow up pending testing results.   Any Other Special Instructions Will Be Listed Below (If Applicable).     If you need a refill on your cardiac medications before your next appointment, please call your pharmacy.

## 2022-12-02 NOTE — Progress Notes (Signed)
Cardiology Office Note  Date: 12/02/2022   ID: James Mccall, DOB September 02, 1946, MRN [id number]  PCP:  Sharilyn Sites, MD  Cardiologist:  Rozann Lesches, MD Electrophysiologist:  None   Chief Complaint  Patient presents with   Cardiac follow-up    History of Present Illness: James Mccall is a 77 y.o. male last seen in May 2022.  He presents overdue for follow-up.  States he had COVID-19 early last year.  Had a prolonged course of fatigue and lack of energy thereafter, generally feels better now but still not back to his prior baseline in terms of walking regimen.  He does not report any angina at this point, no palpitations or syncope.  No leg swelling, orthopnea or PND.  His last echocardiogram was in May 2022 at which point LVEF was 55 to 60%.  We discussed getting an updated study given prior history of nonischemic cardiomyopathy and his interval COVID-19 diagnosis.  I reviewed his medications and his most recent lab work.  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     Ascorbic Acid (VITAMIN C) 1000 MG tablet Take 1,000 mg by mouth daily.     atorvastatin (LIPITOR) 20 MG tablet Take 20 mg by mouth daily.     cephALEXin (KEFLEX) 500 MG capsule Take 1 capsule (500 mg total) by mouth at bedtime. 10 capsule 0   fluticasone (FLONASE) 50 MCG/ACT nasal spray Place 1 spray into the nose as needed for allergies.      ipratropium (ATROVENT) 0.03 % nasal spray Place 2 sprays into both nostrils 2  (two) times daily as needed for rhinitis.     meloxicam (MOBIC) 7.5 MG tablet Take 15 mg by mouth daily.     montelukast (SINGULAIR) 10 MG tablet Take 10 mg by mouth daily.     Multiple Vitamins-Minerals (MENS 50+ MULTIVITAMIN) TABS Take 1 tablet by mouth daily.     pantoprazole (PROTONIX) 40 MG tablet Take 40 mg by mouth daily.     Quercetin 500 MG CAPS Take 500 mg by mouth daily.     zinc gluconate 50 MG tablet Take 50 mg by mouth daily.     No current facility-administered medications for this visit.   Allergies:  Patient has no known allergies.   ROS: No syncope.  Physical Exam: VS:  BP (!) 140/80   Pulse 96   Ht '5\' 9"'$  (1.753 m)   Wt 203 lb 6.4 oz (92.3 kg)   SpO2 97%   BMI 30.04 kg/m , BMI Body mass index is 30.04 kg/m.  Wt Readings from Last 3 Encounters:  12/02/22 203 lb 6.4 oz (92.3 kg)  09/18/22 203 lb (92.1 kg)  09/15/22 203 lb 12.8 oz (92.4 kg)    General: Patient appears comfortable at rest. HEENT: Conjunctiva and lids normal. Neck: Supple, no elevated JVP or carotid bruits. Lungs: Clear to auscultation, nonlabored breathing at rest. Cardiac: Regular rate and rhythm, no  S3 or significant systolic murmur. Extremities: No pitting edema.  ECG:  An ECG dated 09/18/2022 was personally reviewed today and demonstrated:  Sinus rhythm with LVH, nonspecific T wave changes.  Recent Labwork: September 2023: Cholesterol 156, triglycerides 73, HDL 51, LDL 91 09/18/2022: BUN 21; Creatinine, Ser 0.99; Hemoglobin 13.0; Platelets 196; Potassium 3.9; Sodium 140   Other Studies Reviewed Today:  Echocardiogram 04/18/2021:  1. Left ventricular ejection fraction, by estimation, is 55 to 60%. The  left ventricle has normal function. The left ventricle has no regional  wall motion abnormalities. Left ventricular diastolic parameters are  consistent with Grade I diastolic  dysfunction (impaired relaxation).   2. Right ventricular systolic function is normal. The right ventricular   size is normal. There is normal pulmonary artery systolic pressure. The  estimated right ventricular systolic pressure is 58.3 mmHg.   3. Left atrial size was mildly dilated.   4. Right atrial size was mildly dilated.   5. The mitral valve is grossly normal. Trivial mitral valve  regurgitation.   6. The aortic valve is tricuspid. Aortic valve regurgitation is trivial.  Mild aortic valve sclerosis is present, with no evidence of aortic valve  stenosis.   7. The inferior vena cava is normal in size with greater than 50%  respiratory variability, suggesting right atrial pressure of 3 mmHg.   Assessment and Plan:  1.  History of nonischemic cardiomyopathy, LVEF normal at 55 to 60% by echocardiogram in May 2022.  Did have interval diagnosis of COVID-19 last year as discussed above with subsequent fatigue and dyspnea on exertion which has not returned to baseline.  We will plan an updated echocardiogram to ensure no decrease in LVEF that would necessitate further medication adjustments.  2.  Essential hypertension, remains on Norvasc with follow-up by Dr. Hilma Favors.  Blood pressure mildly elevated today.  Would keep an eye on trend in case he needs further up titration of therapy.  Medication Adjustments/Labs and Tests Ordered: Current medicines are reviewed at length with the patient today.  Concerns regarding medicines are outlined above.   Tests Ordered: Orders Placed This Encounter  Procedures   ECHOCARDIOGRAM COMPLETE    Medication Changes: No orders of the defined types were placed in this encounter.   Disposition:  Follow up  test results.  Signed, Satira Sark, MD, [facility] 12/02/2022 1:57 PM    [facility] [address] [place], [place] [address] Phone: [phone]; Fax: [phone]

## 2022-12-10 ENCOUNTER — Ambulatory Visit (HOSPITAL_COMMUNITY)
Admission: RE | Admit: 2022-12-10 | Discharge: 2022-12-10 | Disposition: A | Discharge status: Home / Self Care | Payer: Medicare HMO | Source: Ambulatory Visit | Attending: Cardiology | Admitting: Cardiology

## 2022-12-10 DIAGNOSIS — R0602 Shortness of breath: Secondary | ICD-10-CM | POA: Diagnosis not present

## 2022-12-10 LAB — ECHOCARDIOGRAM COMPLETE
AR max vel: 3.03 cm2
AV Area VTI: 3.06 cm2
AV Area mean vel: 2.91 cm2
AV Mean grad: 3 mmHg
AV Peak grad: 6.7 mmHg
Ao pk vel: 1.29 m/s
Area-P 1/2: 3.19 cm2
MV VTI: 4.19 cm2
P 1/2 time: 461 msec
S' Lateral: 4 cm

## 2022-12-10 NOTE — Progress Notes (Signed)
*  PRELIMINARY RESULTS* Echocardiogram 2D Echocardiogram has been performed.  James Mccall 12/10/2022, 1:56 PM

## 2023-01-27 DIAGNOSIS — N401 Enlarged prostate with lower urinary tract symptoms: Secondary | ICD-10-CM | POA: Diagnosis not present

## 2023-01-27 DIAGNOSIS — N5201 Erectile dysfunction due to arterial insufficiency: Secondary | ICD-10-CM | POA: Diagnosis not present

## 2023-01-27 DIAGNOSIS — R351 Nocturia: Secondary | ICD-10-CM | POA: Diagnosis not present

## 2023-02-23 ENCOUNTER — Ambulatory Visit: Payer: Medicare HMO | Admitting: Cardiology

## 2023-02-23 DIAGNOSIS — R7309 Other abnormal glucose: Secondary | ICD-10-CM | POA: Diagnosis not present

## 2023-02-23 DIAGNOSIS — Z1331 Encounter for screening for depression: Secondary | ICD-10-CM | POA: Diagnosis not present

## 2023-02-23 DIAGNOSIS — I10 Essential (primary) hypertension: Secondary | ICD-10-CM | POA: Diagnosis not present

## 2023-02-23 DIAGNOSIS — N400 Enlarged prostate without lower urinary tract symptoms: Secondary | ICD-10-CM | POA: Diagnosis not present

## 2023-02-23 DIAGNOSIS — E7849 Other hyperlipidemia: Secondary | ICD-10-CM | POA: Diagnosis not present

## 2023-02-23 DIAGNOSIS — Z0000 Encounter for general adult medical examination without abnormal findings: Secondary | ICD-10-CM | POA: Diagnosis not present

## 2023-02-23 DIAGNOSIS — E782 Mixed hyperlipidemia: Secondary | ICD-10-CM | POA: Diagnosis not present

## 2023-02-23 DIAGNOSIS — E663 Overweight: Secondary | ICD-10-CM | POA: Diagnosis not present

## 2023-04-14 DIAGNOSIS — R32 Unspecified urinary incontinence: Secondary | ICD-10-CM | POA: Diagnosis not present

## 2023-04-14 DIAGNOSIS — I129 Hypertensive chronic kidney disease with stage 1 through stage 4 chronic kidney disease, or unspecified chronic kidney disease: Secondary | ICD-10-CM | POA: Diagnosis not present

## 2023-04-14 DIAGNOSIS — Z791 Long term (current) use of non-steroidal anti-inflammatories (NSAID): Secondary | ICD-10-CM | POA: Diagnosis not present

## 2023-04-14 DIAGNOSIS — Z008 Encounter for other general examination: Secondary | ICD-10-CM | POA: Diagnosis not present

## 2023-04-14 DIAGNOSIS — Z833 Family history of diabetes mellitus: Secondary | ICD-10-CM | POA: Diagnosis not present

## 2023-04-14 DIAGNOSIS — Z87891 Personal history of nicotine dependence: Secondary | ICD-10-CM | POA: Diagnosis not present

## 2023-04-14 DIAGNOSIS — Z8249 Family history of ischemic heart disease and other diseases of the circulatory system: Secondary | ICD-10-CM | POA: Diagnosis not present

## 2023-04-14 DIAGNOSIS — M1990 Unspecified osteoarthritis, unspecified site: Secondary | ICD-10-CM | POA: Diagnosis not present

## 2023-04-14 DIAGNOSIS — J309 Allergic rhinitis, unspecified: Secondary | ICD-10-CM | POA: Diagnosis not present

## 2023-04-14 DIAGNOSIS — N182 Chronic kidney disease, stage 2 (mild): Secondary | ICD-10-CM | POA: Diagnosis not present

## 2023-04-14 DIAGNOSIS — K219 Gastro-esophageal reflux disease without esophagitis: Secondary | ICD-10-CM | POA: Diagnosis not present

## 2023-04-14 DIAGNOSIS — E785 Hyperlipidemia, unspecified: Secondary | ICD-10-CM | POA: Diagnosis not present

## 2023-04-14 DIAGNOSIS — Z809 Family history of malignant neoplasm, unspecified: Secondary | ICD-10-CM | POA: Diagnosis not present

## 2023-08-11 DIAGNOSIS — R351 Nocturia: Secondary | ICD-10-CM | POA: Diagnosis not present

## 2023-08-11 DIAGNOSIS — N5201 Erectile dysfunction due to arterial insufficiency: Secondary | ICD-10-CM | POA: Diagnosis not present

## 2023-08-11 DIAGNOSIS — N401 Enlarged prostate with lower urinary tract symptoms: Secondary | ICD-10-CM | POA: Diagnosis not present

## 2023-11-11 DIAGNOSIS — Z20828 Contact with and (suspected) exposure to other viral communicable diseases: Secondary | ICD-10-CM | POA: Diagnosis not present

## 2023-11-11 DIAGNOSIS — A493 Mycoplasma infection, unspecified site: Secondary | ICD-10-CM | POA: Diagnosis not present

## 2023-11-11 DIAGNOSIS — Z6825 Body mass index (BMI) 25.0-25.9, adult: Secondary | ICD-10-CM | POA: Diagnosis not present

## 2023-12-01 DIAGNOSIS — I10 Essential (primary) hypertension: Secondary | ICD-10-CM | POA: Diagnosis not present

## 2023-12-01 DIAGNOSIS — E663 Overweight: Secondary | ICD-10-CM | POA: Diagnosis not present

## 2023-12-01 DIAGNOSIS — Z6825 Body mass index (BMI) 25.0-25.9, adult: Secondary | ICD-10-CM | POA: Diagnosis not present

## 2024-02-21 DIAGNOSIS — H1131 Conjunctival hemorrhage, right eye: Secondary | ICD-10-CM | POA: Diagnosis not present

## 2024-02-21 DIAGNOSIS — R7303 Prediabetes: Secondary | ICD-10-CM | POA: Diagnosis not present

## 2024-02-21 DIAGNOSIS — Z6825 Body mass index (BMI) 25.0-25.9, adult: Secondary | ICD-10-CM | POA: Diagnosis not present

## 2024-05-03 DIAGNOSIS — M5412 Radiculopathy, cervical region: Secondary | ICD-10-CM | POA: Diagnosis not present

## 2024-05-03 DIAGNOSIS — R7303 Prediabetes: Secondary | ICD-10-CM | POA: Diagnosis not present

## 2024-05-03 DIAGNOSIS — I10 Essential (primary) hypertension: Secondary | ICD-10-CM | POA: Diagnosis not present

## 2024-05-03 DIAGNOSIS — Z6824 Body mass index (BMI) 24.0-24.9, adult: Secondary | ICD-10-CM | POA: Diagnosis not present

## 2024-05-03 DIAGNOSIS — N410 Acute prostatitis: Secondary | ICD-10-CM | POA: Diagnosis not present

## 2024-05-03 DIAGNOSIS — N400 Enlarged prostate without lower urinary tract symptoms: Secondary | ICD-10-CM | POA: Diagnosis not present

## 2024-06-19 ENCOUNTER — Other Ambulatory Visit (INDEPENDENT_AMBULATORY_CARE_PROVIDER_SITE_OTHER): Payer: Self-pay

## 2024-06-19 ENCOUNTER — Encounter: Payer: Self-pay | Admitting: Orthopedic Surgery

## 2024-06-19 ENCOUNTER — Ambulatory Visit: Admitting: Orthopedic Surgery

## 2024-06-19 VITALS — BP 189/103 | HR 71 | Ht 69.0 in | Wt 204.0 lb

## 2024-06-19 DIAGNOSIS — G8929 Other chronic pain: Secondary | ICD-10-CM

## 2024-06-19 DIAGNOSIS — M542 Cervicalgia: Secondary | ICD-10-CM

## 2024-06-19 DIAGNOSIS — M47812 Spondylosis without myelopathy or radiculopathy, cervical region: Secondary | ICD-10-CM

## 2024-06-19 MED ORDER — TIZANIDINE HCL 4 MG PO TABS: 4.0000 mg | ORAL_TABLET | Freq: Every day | ORAL | 1 refills | Status: AC | PRN

## 2024-06-19 NOTE — Progress Notes (Signed)
  Intake history:  BP (!) 189/103   Pulse 71   Ht 5' 9 (1.753 m)   Wt 204 lb (92.5 kg)   BMI 30.13 kg/m  Body mass index is 30.13 kg/m.    WHAT ARE WE SEEING YOU FOR TODAY?   neck  How long has this bothered you? (DOI?DOS?WS?)  several year(s) ago  Anticoag.  No  Diabetes No  Heart disease No  Hypertension Yes  SMOKING HX No  Kidney disease No  Any ALLERGIES _____________No Known Allergies _________________________________   Treatment:  Have you taken:  Tylenol  No  Advil No  Had PT No  Had injection No  Other  _________________________

## 2024-06-19 NOTE — Patient Instructions (Addendum)
 Surgery is not indicated in this case  Recommendations are for physical therapy, topical muscle creams such as Bengay, Aspercreme and muscle relaxers when needed  If the pain runs down your arm into your hand, you experience numbness or tingling or weakness then a referral to a spine surgeon would be indicated  Physical therapy has been ordered for you at [facility]. They should call you to schedule, [phone] is the phone number to call, if you want to call to schedule.
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
## 2024-08-09 NOTE — Therapy (Unsigned)
 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   GERD (gastroesophageal reflux disease)    History of acute pyelonephritis 01/2017   Hyperlipidemia    Hypertension    Nonischemic cardiomyopathy (HCC)    Right ureteral stone    Seasonal allergies    Past Surgical History:  Procedure Laterality Date   CATARACT EXTRACTION W/PHACO Right 08/15/2015   Procedure: CATARACT EXTRACTION PHACO AND INTRAOCULAR LENS PLACEMENT (IOC);  Surgeon: Cherene Mania, MD;  Location: [facility];  Service: Ophthalmology;  Laterality: Right;  CDE:5.75   CATARACT EXTRACTION W/PHACO Left 09/02/2015   Procedure: CATARACT EXTRACTION PHACO AND INTRAOCULAR LENS PLACEMENT LEFT EYE CDE=5.93;  Surgeon: Cherene Mania, MD;  Location: [facility];  Service: Ophthalmology;  Laterality: Left;   COLONOSCOPY N/A 08/28/2013   Procedure: COLONOSCOPY;  Surgeon: Lamar CHRISTELLA Hollingshead, MD;  [facility];  Service: Endoscopy;  Laterality: N/A;  9:45   COLONOSCOPY WITH PROPOFOL  N/A 03/30/2022   Procedure: COLONOSCOPY WITH PROPOFOL ;  Surgeon: Hollingshead Lamar CHRISTELLA, MD;  [facility];  Service: Endoscopy;  Laterality: N/A;  2:00 / ASA 2   CYSTOSCOPY W/ URETERAL STENT PLACEMENT Right 01/26/2017   Procedure: CYSTOSCOPY WITH RETROGRADE PYELOGRAM/URETERAL STENT PLACEMENT;  Surgeon: Norleen Seltzer, MD;  Location: [facility];  Service: Urology;  Laterality: Right;   CYSTOSCOPY WITH URETEROSCOPY AND STENT PLACEMENT Right 03/05/2017   Procedure: cystoscopy with right ureteroscopy and stent exchange ;  Surgeon: Donnice Brooks, MD;  [facility];  Service: Urology;  Laterality: Right;   NASAL SINUS SURGERY  2012    POLYPECTOMY  03/30/2022   Procedure: POLYPECTOMY INTESTINAL;  Surgeon: Hollingshead Lamar CHRISTELLA, MD;  [facility];  Service: Endoscopy;;   RIGHT/LEFT HEART CATH AND CORONARY ANGIOGRAPHY N/A 10/26/2017   Procedure: RIGHT/LEFT HEART CATH AND CORONARY ANGIOGRAPHY;  Surgeon: Verlin Lonni BIRCH, MD;  [facility];  Service: Cardiovascular;  Laterality: N/A;   ROTATOR CUFF REPAIR Bilateral 1998   THULIUM LASER TURP (TRANSURETHRAL RESECTION OF PROSTATE) N/A 09/15/2022   Procedure: THULIUM LASER TURP (TRANSURETHRAL RESECTION OF PROSTATE);  Surgeon: Brooks Donnice, MD;  Location[facility];  Service: Urology;  Laterality: N/A;   Vocal cord polyps  1990's   Benign   Patient Active Problem List   Diagnosis Date Noted   RBC microcytosis 06/24/2018   Abnormal myocardial perfusion study    Shortness of breath    Pyelonephritis 01/26/2017   Right ureteral stone 01/26/2017   Sepsis (HCC) 01/26/2017   AKI (acute kidney injury) (HCC) 01/26/2017   Hyperlipidemia    BPH (benign prostatic hyperplasia)    GERD (gastroesophageal reflux disease)    Gastroesophageal reflux disease without esophagitis    Beta thalassemia minor 07/09/2015   Decreased energy 06/25/2015   History of BPH 06/25/2015   Rectal bleeding 08/21/2013    PCP: ***  REFERRING PROVIDER: Margrette Taft FORBES, MD  REFERRING DIAG: M54.2 (ICD-10-CM) - Neck pain M47.812 (ICD-10-CM) - Spondylosis without myelopathy or radiculopathy, cervical region  THERAPY DIAG:  No diagnosis found.  Rationale for Evaluation and Treatment: {HABREHAB:27488}  ONSET DATE: ***  SUBJECTIVE:  SUBJECTIVE STATEMENT: *** Hand dominance: {MISC; OT HAND DOMINANCE:[phone]}  PERTINENT HISTORY:  ***  PAIN:  Are you  having pain? {OPRCPAIN:27236}  PRECAUTIONS: {Therapy precautions:24002}  RED FLAGS: {PT Red Flags:29287}     WEIGHT BEARING RESTRICTIONS: {Yes ***/No:24003}  FALLS:  Has patient fallen in last 6 months? {fallsyesno:27318}  LIVING ENVIRONMENT: Lives with: {OPRC lives with:25569::lives with their family} Lives in: {Lives in:25570} Stairs: {opstairs:27293} Has following equipment at home: {Assistive devices:23999}  OCCUPATION: ***  PLOF: {PLOF:24004}  PATIENT GOALS: ***  NEXT MD VISIT: ***  OBJECTIVE:  Note: Objective measures were completed at Evaluation unless otherwise noted.  DIAGNOSTIC FINDINGS:   Reading: Multilevel degenerative disc disease abnormal alignment lateral and AP views uncovertebral sclerosis osteophyte formation joint space narrowing  Impression: Severe cervical spondylosis  PATIENT SURVEYS:  {rehab surveys:24030}  COGNITION: Overall cognitive status: {cognition:24006}  SENSATION: {sensation:27233}  POSTURE: {posture:25561}  PALPATION: ***   CERVICAL ROM:   {AROM/PROM:27142} ROM A/PROM (deg) eval  Flexion   Extension   Right lateral flexion   Left lateral flexion   Right rotation   Left rotation    (Blank rows = not tested)  UPPER EXTREMITY ROM:  {AROM/PROM:27142} ROM Right eval Left eval  Shoulder flexion    Shoulder extension    Shoulder abduction    Shoulder adduction    Shoulder extension    Shoulder internal rotation    Shoulder external rotation    Elbow flexion    Elbow extension    Wrist flexion    Wrist extension    Wrist ulnar deviation    Wrist radial deviation    Wrist pronation    Wrist supination     (Blank rows = not tested)  UPPER EXTREMITY MMT:  MMT Right eval Left eval  Shoulder flexion    Shoulder extension    Shoulder abduction    Shoulder adduction    Shoulder extension    Shoulder internal rotation    Shoulder external rotation    Middle trapezius    Lower trapezius    Elbow  flexion    Elbow extension    Wrist flexion    Wrist extension    Wrist ulnar deviation    Wrist radial deviation    Wrist pronation    Wrist supination    Grip strength     (Blank rows = not tested)  CERVICAL SPECIAL TESTS:  {Cervical special tests:25246}  FUNCTIONAL TESTS:  {Functional tests:24029}  TREATMENT DATE: ***                                                                                                                                 PATIENT EDUCATION:  Education details: *** Person educated: {Person educated:25204} Education method: {Education Method:25205} Education comprehension: {Education Comprehension:25206}  HOME EXERCISE PROGRAM: ***  ASSESSMENT:  CLINICAL IMPRESSION: Patient is a *** y.o. *** who was seen today for physical therapy evaluation and treatment for ***.   OBJECTIVE  IMPAIRMENTS: {opptimpairments:25111}.   ACTIVITY LIMITATIONS: {activitylimitations:27494}  PARTICIPATION LIMITATIONS: {participationrestrictions:25113}  PERSONAL FACTORS: {Personal factors:25162} are also affecting patient's functional outcome.   REHAB POTENTIAL: {rehabpotential:25112}  CLINICAL DECISION MAKING: {clinical decision making:25114}  EVALUATION COMPLEXITY: {Evaluation complexity:25115}   GOALS: Goals reviewed with patient? {yes/no:20286}  SHORT TERM GOALS: Target date: ***  *** Baseline:  Goal status: INITIAL  2.  *** Baseline:  Goal status: INITIAL  3.  *** Baseline:  Goal status: INITIAL  4.  *** Baseline:  Goal status: INITIAL  5.  *** Baseline:  Goal status: INITIAL  6.  *** Baseline:  Goal status: INITIAL  LONG TERM GOALS: Target date: ***  *** Baseline:  Goal status: INITIAL  2.  *** Baseline:  Goal status: INITIAL  3.  *** Baseline:  Goal status: INITIAL  4.  *** Baseline:  Goal status: INITIAL  5.  *** Baseline:  Goal status: INITIAL  6.  *** Baseline:  Goal status: INITIAL   PLAN:  PT FREQUENCY:  {rehab frequency:25116}  PT DURATION: {rehab duration:25117}  PLANNED INTERVENTIONS: {rehab planned interventions:25118::97110-Therapeutic exercises,97530- Therapeutic [phone]- Neuromuscular re-education,97535- Self Rjmz,02859- Manual therapy,Patient/Family education}  PLAN FOR NEXT SESSION: ***   Kody Brandl E Powell-Butler, PT 08/09/2024, 6:33 PM

## 2024-08-10 ENCOUNTER — Ambulatory Visit (HOSPITAL_COMMUNITY)

## 2024-08-16 ENCOUNTER — Ambulatory Visit (HOSPITAL_COMMUNITY): Attending: Orthopedic Surgery

## 2024-08-16 ENCOUNTER — Encounter (HOSPITAL_COMMUNITY): Payer: Self-pay

## 2024-08-16 ENCOUNTER — Other Ambulatory Visit: Payer: Self-pay

## 2024-08-16 DIAGNOSIS — M47812 Spondylosis without myelopathy or radiculopathy, cervical region: Secondary | ICD-10-CM | POA: Insufficient documentation

## 2024-08-16 DIAGNOSIS — M542 Cervicalgia: Secondary | ICD-10-CM | POA: Diagnosis not present
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
## 2024-08-29 ENCOUNTER — Encounter (HOSPITAL_COMMUNITY): Payer: Self-pay

## 2024-08-29 ENCOUNTER — Ambulatory Visit (HOSPITAL_COMMUNITY): Attending: Orthopedic Surgery

## 2024-08-29 DIAGNOSIS — M542 Cervicalgia: Secondary | ICD-10-CM | POA: Diagnosis not present
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
## 2024-08-31 ENCOUNTER — Encounter (HOSPITAL_COMMUNITY): Admitting: Physical Therapy

## 2024-09-11 ENCOUNTER — Encounter (HOSPITAL_COMMUNITY)

## 2024-09-12 ENCOUNTER — Encounter (HOSPITAL_COMMUNITY)

## 2024-09-18 ENCOUNTER — Encounter (HOSPITAL_COMMUNITY): Admitting: Physical Therapy

## 2024-09-19 ENCOUNTER — Ambulatory Visit (HOSPITAL_COMMUNITY)
